# Patient Record
Sex: Female | Born: 1990 | Hispanic: No | Marital: Married | Smoking: Never smoker
Health system: Southern US, Community
[De-identification: ages and names within clinical notes are randomized; demographics above are authoritative.]

## PROBLEM LIST (undated history)

## (undated) DIAGNOSIS — O09299 Supervision of pregnancy with other poor reproductive or obstetric history, unspecified trimester: Secondary | ICD-10-CM

## (undated) DIAGNOSIS — L509 Urticaria, unspecified: Secondary | ICD-10-CM

## (undated) DIAGNOSIS — O139 Gestational [pregnancy-induced] hypertension without significant proteinuria, unspecified trimester: Secondary | ICD-10-CM

## (undated) HISTORY — DX: Gestational (pregnancy-induced) hypertension without significant proteinuria, unspecified trimester: O13.9

## (undated) HISTORY — DX: Supervision of pregnancy with other poor reproductive or obstetric history, unspecified trimester: O09.299

## (undated) HISTORY — DX: Urticaria, unspecified: L50.9

---
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
## 2013-02-22 NOTE — L&D Delivery Note (Signed)
Delivery Note   Cervix was complete at 1800 and labored down, FHR decel noted about 1930, pt still did not have urge to push, FHR continued to have decels, pt began actively pushing about 2030, FHR w variables, but overall reassuring, and difficulty tracing, FSE was placed, pt pushing well,   At 9:26 PM a viable female was delivered via Vaginal, Spontaneous Delivery (Presentation: ; Occiput Posterior).  Terminal meconium noted, immediate lusty cry APGAR: 8, 9; weight 8 lb 4.5  oz (3756 g).   Placenta status: Intact, Spontaneous.  Cord: 3 vessels with the following complications: None.  Cord pH: n/a   Anesthesia: Epidural  Episiotomy: None Lacerations: 2nd degree Suture Repair: 3.0 vicryl Est. Blood Loss (mL): 200  Mom to postpartum.  Baby to Couplet care / Skin to Skin. Pt plans to BF Routine PP orders   Malissa HippoShelley M Jacynda Brunke 06/01/2013, 11:27 PM

## 2013-06-01 ENCOUNTER — Encounter (HOSPITAL_COMMUNITY): Payer: Medicaid Other | Admitting: Anesthesiology

## 2013-06-01 ENCOUNTER — Encounter (HOSPITAL_COMMUNITY): Payer: Self-pay | Admitting: *Deleted

## 2013-06-01 ENCOUNTER — Inpatient Hospital Stay (HOSPITAL_COMMUNITY)
Admission: AD | Admit: 2013-06-01 | Discharge: 2013-06-03 | DRG: 775 | Disposition: A | Discharge status: Home / Self Care | Payer: Medicaid Other | Source: Ambulatory Visit | Attending: Obstetrics and Gynecology | Admitting: Obstetrics and Gynecology

## 2013-06-01 ENCOUNTER — Inpatient Hospital Stay (HOSPITAL_COMMUNITY): Payer: Medicaid Other | Admitting: Anesthesiology

## 2013-06-01 DIAGNOSIS — Z9289 Personal history of other medical treatment: Secondary | ICD-10-CM

## 2013-06-01 DIAGNOSIS — Z22330 Carrier of Group B streptococcus: Secondary | ICD-10-CM

## 2013-06-01 DIAGNOSIS — O9989 Other specified diseases and conditions complicating pregnancy, childbirth and the puerperium: Secondary | ICD-10-CM

## 2013-06-01 DIAGNOSIS — O360990 Maternal care for other rhesus isoimmunization, unspecified trimester, not applicable or unspecified: Secondary | ICD-10-CM | POA: Diagnosis present

## 2013-06-01 DIAGNOSIS — IMO0002 Reserved for concepts with insufficient information to code with codable children: Secondary | ICD-10-CM | POA: Diagnosis present

## 2013-06-01 DIAGNOSIS — O99892 Other specified diseases and conditions complicating childbirth: Secondary | ICD-10-CM | POA: Diagnosis present

## 2013-06-01 DIAGNOSIS — IMO0001 Reserved for inherently not codable concepts without codable children: Secondary | ICD-10-CM

## 2013-06-01 DIAGNOSIS — Z87891 Personal history of nicotine dependence: Secondary | ICD-10-CM

## 2013-06-01 LAB — OB RESULTS CONSOLE GC/CHLAMYDIA
Chlamydia: NEGATIVE
Gonorrhea: NEGATIVE

## 2013-06-01 LAB — CBC
HEMATOCRIT: 36.4 % (ref 36.0–46.0)
Hemoglobin: 12.1 g/dL (ref 12.0–15.0)
MCH: 27.7 pg (ref 26.0–34.0)
MCHC: 33.2 g/dL (ref 30.0–36.0)
MCV: 83.3 fL (ref 78.0–100.0)
Platelets: 243 10*3/uL (ref 150–400)
RBC: 4.37 MIL/uL (ref 3.87–5.11)
RDW: 14.9 % (ref 11.5–15.5)
WBC: 6.5 10*3/uL (ref 4.0–10.5)

## 2013-06-01 LAB — OB RESULTS CONSOLE ABO/RH: RH TYPE: NEGATIVE

## 2013-06-01 LAB — OB RESULTS CONSOLE HIV ANTIBODY (ROUTINE TESTING): HIV: NONREACTIVE

## 2013-06-01 LAB — OB RESULTS CONSOLE GBS: STREP GROUP B AG: POSITIVE

## 2013-06-01 LAB — OB RESULTS CONSOLE RPR: RPR: NONREACTIVE

## 2013-06-01 LAB — OB RESULTS CONSOLE RUBELLA ANTIBODY, IGM: Rubella: IMMUNE

## 2013-06-01 LAB — AMNISURE RUPTURE OF MEMBRANE (ROM) NOT AT ARMC: Amnisure ROM: POSITIVE

## 2013-06-01 LAB — RPR: RPR Ser Ql: REACTIVE — AB

## 2013-06-01 LAB — OB RESULTS CONSOLE HEPATITIS B SURFACE ANTIGEN: Hepatitis B Surface Ag: NEGATIVE

## 2013-06-01 LAB — OB RESULTS CONSOLE ANTIBODY SCREEN: ANTIBODY SCREEN: NEGATIVE

## 2013-06-01 LAB — RPR TITER

## 2013-06-01 MED ORDER — IBUPROFEN 600 MG PO TABS
600.0000 mg | ORAL_TABLET | Freq: Four times a day (QID) | ORAL | Status: DC
  Administered 2013-06-02 – 2013-06-03 (×6): 600 mg via ORAL
  Filled 2013-06-01 (×6): qty 1

## 2013-06-01 MED ORDER — ONDANSETRON HCL 4 MG/2ML IJ SOLN
4.0000 mg | Freq: Four times a day (QID) | INTRAMUSCULAR | Status: DC | PRN
  Administered 2013-06-01: 4 mg via INTRAVENOUS
  Filled 2013-06-01: qty 2

## 2013-06-01 MED ORDER — CITRIC ACID-SODIUM CITRATE 334-500 MG/5ML PO SOLN: 30.0000 mL | ORAL | Status: DC | PRN

## 2013-06-01 MED ORDER — BISACODYL 10 MG RE SUPP: 10.0000 mg | Freq: Every day | RECTAL | Status: DC | PRN

## 2013-06-01 MED ORDER — PRENATAL MULTIVITAMIN CH
1.0000 | ORAL_TABLET | Freq: Every day | ORAL | Status: DC
  Administered 2013-06-02 – 2013-06-03 (×2): 1 via ORAL
  Filled 2013-06-01 (×2): qty 1

## 2013-06-01 MED ORDER — FENTANYL 2.5 MCG/ML BUPIVACAINE 1/10 % EPIDURAL INFUSION (WH - ANES)
14.0000 mL/h | INTRAMUSCULAR | Status: DC | PRN
  Administered 2013-06-01 (×2): 14 mL/h via EPIDURAL
  Filled 2013-06-01 (×2): qty 125

## 2013-06-01 MED ORDER — ONDANSETRON HCL 4 MG PO TABS: 4.0000 mg | ORAL_TABLET | ORAL | Status: DC | PRN

## 2013-06-01 MED ORDER — PHENYLEPHRINE 40 MCG/ML (10ML) SYRINGE FOR IV PUSH (FOR BLOOD PRESSURE SUPPORT)
80.0000 ug | PREFILLED_SYRINGE | INTRAVENOUS | Status: DC | PRN
Start: 2013-06-01 — End: 2013-06-01
  Filled 2013-06-01: qty 2
  Filled 2013-06-01: qty 10

## 2013-06-01 MED ORDER — SIMETHICONE 80 MG PO CHEW: 80.0000 mg | CHEWABLE_TABLET | ORAL | Status: DC | PRN

## 2013-06-01 MED ORDER — WITCH HAZEL-GLYCERIN EX PADS: 1.0000 "application " | MEDICATED_PAD | CUTANEOUS | Status: DC | PRN

## 2013-06-01 MED ORDER — ACETAMINOPHEN 325 MG PO TABS: 650.0000 mg | ORAL_TABLET | ORAL | Status: DC | PRN

## 2013-06-01 MED ORDER — BENZOCAINE-MENTHOL 20-0.5 % EX AERO
1.0000 "application " | INHALATION_SPRAY | CUTANEOUS | Status: DC | PRN
  Administered 2013-06-02: 1 via TOPICAL
  Filled 2013-06-01: qty 56

## 2013-06-01 MED ORDER — LACTATED RINGERS IV SOLN
INTRAVENOUS | Status: DC
  Administered 2013-06-01 (×2): via INTRAVENOUS

## 2013-06-01 MED ORDER — OXYCODONE-ACETAMINOPHEN 5-325 MG PO TABS: 1.0000 | ORAL_TABLET | ORAL | Status: DC | PRN

## 2013-06-01 MED ORDER — OXYTOCIN BOLUS FROM INFUSION
500.0000 mL | INTRAVENOUS | Status: DC
  Administered 2013-06-01: 500 mL via INTRAVENOUS

## 2013-06-01 MED ORDER — LACTATED RINGERS IV SOLN: 500.0000 mL | INTRAVENOUS | Status: DC | PRN

## 2013-06-01 MED ORDER — TERBUTALINE SULFATE 1 MG/ML IJ SOLN: 0.2500 mg | Freq: Once | INTRAMUSCULAR | Status: DC | PRN

## 2013-06-01 MED ORDER — EPHEDRINE 5 MG/ML INJ
10.0000 mg | INTRAVENOUS | Status: DC | PRN
  Filled 2013-06-01: qty 4
  Filled 2013-06-01: qty 2

## 2013-06-01 MED ORDER — DIBUCAINE 1 % RE OINT: 1.0000 "application " | TOPICAL_OINTMENT | RECTAL | Status: DC | PRN

## 2013-06-01 MED ORDER — LANOLIN HYDROUS EX OINT: TOPICAL_OINTMENT | CUTANEOUS | Status: DC | PRN

## 2013-06-01 MED ORDER — LIDOCAINE HCL (PF) 1 % IJ SOLN
30.0000 mL | INTRAMUSCULAR | Status: DC | PRN
  Administered 2013-06-01: 30 mL via SUBCUTANEOUS
  Filled 2013-06-01: qty 30

## 2013-06-01 MED ORDER — OXYTOCIN 40 UNITS IN LACTATED RINGERS INFUSION - SIMPLE MED
1.0000 m[IU]/min | INTRAVENOUS | Status: DC
  Administered 2013-06-01: 1 m[IU]/min via INTRAVENOUS
  Filled 2013-06-01: qty 1000

## 2013-06-01 MED ORDER — ZOLPIDEM TARTRATE 5 MG PO TABS: 5.0000 mg | ORAL_TABLET | Freq: Every evening | ORAL | Status: DC | PRN

## 2013-06-01 MED ORDER — DIPHENHYDRAMINE HCL 50 MG/ML IJ SOLN: 12.5000 mg | INTRAMUSCULAR | Status: DC | PRN

## 2013-06-01 MED ORDER — EPHEDRINE 5 MG/ML INJ
10.0000 mg | INTRAVENOUS | Status: DC | PRN
  Filled 2013-06-01: qty 2

## 2013-06-01 MED ORDER — DIPHENHYDRAMINE HCL 25 MG PO CAPS: 25.0000 mg | ORAL_CAPSULE | Freq: Four times a day (QID) | ORAL | Status: DC | PRN

## 2013-06-01 MED ORDER — SENNOSIDES-DOCUSATE SODIUM 8.6-50 MG PO TABS
2.0000 | ORAL_TABLET | ORAL | Status: DC
  Administered 2013-06-02: 2 via ORAL
  Filled 2013-06-01: qty 2

## 2013-06-01 MED ORDER — PHENYLEPHRINE 40 MCG/ML (10ML) SYRINGE FOR IV PUSH (FOR BLOOD PRESSURE SUPPORT)
80.0000 ug | PREFILLED_SYRINGE | INTRAVENOUS | Status: DC | PRN
Start: 2013-06-01 — End: 2013-06-01
  Filled 2013-06-01: qty 2

## 2013-06-01 MED ORDER — PENICILLIN G POTASSIUM 5000000 UNITS IJ SOLR
5.0000 10*6.[IU] | Freq: Once | INTRAVENOUS | Status: AC
  Administered 2013-06-01: 5 10*6.[IU] via INTRAVENOUS
  Filled 2013-06-01: qty 5

## 2013-06-01 MED ORDER — SODIUM BICARBONATE 8.4 % IV SOLN
INTRAVENOUS | Status: DC | PRN
  Administered 2013-06-01: 5 mL via EPIDURAL

## 2013-06-01 MED ORDER — PENICILLIN G POTASSIUM 5000000 UNITS IJ SOLR
2.5000 10*6.[IU] | INTRAVENOUS | Status: DC
  Administered 2013-06-01 (×2): 2.5 10*6.[IU] via INTRAVENOUS
  Filled 2013-06-01 (×5): qty 2.5

## 2013-06-01 MED ORDER — ONDANSETRON HCL 4 MG/2ML IJ SOLN: 4.0000 mg | INTRAMUSCULAR | Status: DC | PRN

## 2013-06-01 MED ORDER — TETANUS-DIPHTH-ACELL PERTUSSIS 5-2.5-18.5 LF-MCG/0.5 IM SUSP: 0.5000 mL | Freq: Once | INTRAMUSCULAR | Status: DC

## 2013-06-01 MED ORDER — OXYTOCIN 40 UNITS IN LACTATED RINGERS INFUSION - SIMPLE MED: 62.5000 mL/h | INTRAVENOUS | Status: DC

## 2013-06-01 MED ORDER — FLEET ENEMA 7-19 GM/118ML RE ENEM: 1.0000 | ENEMA | Freq: Every day | RECTAL | Status: DC | PRN

## 2013-06-01 MED ORDER — MEDROXYPROGESTERONE ACETATE 150 MG/ML IM SUSP: 150.0000 mg | INTRAMUSCULAR | Status: DC | PRN

## 2013-06-01 MED ORDER — LACTATED RINGERS IV SOLN: 500.0000 mL | Freq: Once | INTRAVENOUS | Status: DC

## 2013-06-01 MED ORDER — IBUPROFEN 600 MG PO TABS
600.0000 mg | ORAL_TABLET | Freq: Four times a day (QID) | ORAL | Status: DC | PRN
  Administered 2013-06-01: 600 mg via ORAL
  Filled 2013-06-01: qty 1

## 2013-06-01 MED ORDER — MEASLES, MUMPS & RUBELLA VAC ~~LOC~~ INJ
0.5000 mL | INJECTION | Freq: Once | SUBCUTANEOUS | Status: DC
  Filled 2013-06-01: qty 0.5

## 2013-06-01 NOTE — Progress Notes (Signed)
  Subjective: Patient experiencing some variable decels.   Objective: BP 140/79  Pulse 116  Temp(Src) 98.9 F (37.2 C) (Oral)  Resp 16  Ht 5\' 5"  (1.651 m)  Wt 190 lb (86.183 kg)  BMI 31.62 kg/m2  SpO2 100%   Total I/O In: -  Out: 2550 [Urine:2550]  FHT: 150 bpm, Mod Var, + LateDecels, +Accels UC:   Q1-552mins, moderate--MVUs 150-180 SVE:   10/100/0--J.Orenthal Debski IUPC remains in place  Assessment:  IUP at 39.6wks Cat II FT GBS Positive RPR Reactive 2nd Stage Labor  Plan: Position change FTA-ABS for reactive RPR--results pending Allow to labor down until urge to push Continue to monitor  Marlene BastJessica Lynn Calyse Murcia , CNM, MSN 06/01/2013, 6:04 PM

## 2013-06-01 NOTE — Anesthesia Preprocedure Evaluation (Signed)

## 2013-06-01 NOTE — Progress Notes (Signed)
  Subjective: Patient remains comfortable with epidural.  Objective: BP 126/75  Pulse 94  Temp(Src) 98.2 F (36.8 C) (Oral)  Resp 18  Ht 5\' 5"  (1.651 m)  Wt 190 lb (86.183 kg)  BMI 31.62 kg/m2  SpO2 100%   Total I/O In: -  Out: 1300 [Urine:1300]  FHT:  150 bpm, Mod Var, -Decels, +Accels UC:   Q2-364min, mild to moderate SVE:   Dilation: 5 Effacement (%): 90 Station: -1 Exam by:: Gerrit HeckJessica Cayle Cordoba, cnm  Assessment:  IUP at 39.6wks Cat I FT GBS Positive Inadequate contractions  Plan: Start pitocin, now Continue present mgmt Dr. Audree CamelN. Dillard updated on patient status  Marlene BastJessica Lynn Kamaile Zachow 06/01/2013, 3:06 PM

## 2013-06-01 NOTE — Anesthesia Procedure Notes (Signed)

## 2013-06-01 NOTE — Progress Notes (Signed)
  Subjective:  Patient comfortable after epidural placement.    Objective: BP 133/77  Pulse 101  Temp(Src) 97.4 F (36.3 C) (Oral)  Resp 18  Ht 5\' 5"  (1.651 m)  Wt 190 lb (86.183 kg)  BMI 31.62 kg/m2  SpO2 100%     FHT:  150 bpm, Mod Var, -Decels, +Accels UC:   Q 2-4, moderate SVE:   Dilation: 5 Effacement (%): 90 Station: -1 Exam by:: Gerrit HeckJessica Leighla Chestnutt, cnm IUPC inserted w/o difficulty  Assessment:  IUP at 39.6wks Cat I FT SROM GBS Positive  Plan: Continue present mgmt Dr. Dorris CarnesN. Dillard updated   Joyice FasterJessica Lynn [facility] 06/01/2013, 10:40 AM

## 2013-06-01 NOTE — H&P (Signed)
  Melissa Howell is a 23 y.o. female, G1P0 at 39.6 weeks, presenting to MAU unannounced complaining of LOF. States she awoke at 0530 to use restroom and heard a "popping noise." Upon wiping patient reports some thin mucous type discharge. Patient states she put on a pad and returned to bed. Patient states she awoke again and pad was wet, but not saturated. Patient states that she went to restroom and upon wiping noted some pink discharge that was "running down" her leg. Patient reports contractions and active fetus.   There are no active problems to display for this patient.   History of present pregnancy: Patient entered care at 21.5 weeks.   EDC of 06/02/2013 was established by Definite LMP of 08/26/2012.   Anatomy scan:  21.5 weeks, with normal findings and an posterior placenta.   Additional US evaluations:  Presentation US at 37.5wks confirmed vertex.   [facility]   Last evaluation:  05/31/2013; 1/70/-2  OB History   Grav Para Term Preterm Abortions TAB SAB Ect Mult Living   1              History reviewed. No pertinent past medical history. History reviewed. No pertinent past surgical history. Family History: family history is not on file. Social History:  reports that she has quit smoking. She does not have any smokeless tobacco history on file. She reports that she does not drink alcohol or use illicit drugs.   Prenatal Transfer Tool  Maternal Diabetes: No Genetic Screening: Normal Maternal Ultrasounds/Referrals: Normal Fetal Ultrasounds or other Referrals:  None Maternal Substance Abuse:  No Significant Maternal Medications:  None Significant Maternal Lab Results: Lab values include: Group B Strep positive, Rh negative  ROS:  See Above  No Known Allergies   Dilation: 3 Effacement (%): 80 Station: -1 Exam by:: J. Daneya Hartgrove, CNM Blood pressure 150/87, pulse 104, temperature 98.4 F (36.9 C), temperature source Oral, resp. rate 18, height 5\' 5"   (1.651 m), weight 190 lb (86.183 kg), SpO2 100.00%.  Chest clear Heart RRR without murmur Abd gravid, NT Pelvic: 3/80/-1, midposition, vertex Ext: WNL  FHR: 145 bpm, Mod Var, -Decels, +Accels UCs:  Q1-193min, moderate to palpation Amniosure-Positive  Prenatal labs: ABO, Rh:   O Negative  Antibody:  Negative Rubella:   Immune RPR:   NR HBsAg:   Negative 01/10/13 HIV:   Negative GBS:  Positive 01/10/13 Sickle cell/Hgb electrophoresis:  Normal Pap:  Abnormal 01/25/2013 GC:  Negative Chlamydia:  Negative Genetic screenings:  Negative Glucola: Negtive Other:  N/a    Assessment IUP at 39.6wks Cat I FT SROM GBS Positive  [facility]: [facility] consult with Dr. Audree CamelN. Dillard Routine Labor and Delivery Orders Start PCN for GBS prophylaxis Okay for Epidural, Now  Joyice FasterJessica Lynn [facility]CNM, MSN 06/01/2013, 8:13 AM

## 2013-06-01 NOTE — Progress Notes (Signed)
CRITICAL VALUE ALERT  Critical value received:  RPR reactive with 1:2 titer  Date of notification:  06/01/2013  Time of notification:  1538  Critical value read back:yes  Nurse who received alert: Herma CarsonLindsay Murrel Freet, rn  MD notified (1st page):  Gerrit HeckJessica emly, cnm by cellphone  Time of first page:  1544  MD notified (2nd page):  Time of second page:  Responding MD: Gerrit Heckjessica emly, cnm   Time MD responded:  [phone]

## 2013-06-01 NOTE — MAU Note (Addendum)
PT SAYS SHE THINKS SROM AT 0530-    SHE WAS ASLEEP - WHEN SHE AWOKE -- SHE WENT TO B-ROOM.   CLEAR FLUID-  THEN PINK.  VE IN OFFICE YESTERDAY    1-2 CM.   DENIES HSV AND MRSA.  ON ASSESSMENT-  PERINEUM IS DRY

## 2013-06-01 NOTE — MAU Note (Signed)
Pt reports ROM at 0530, some contractions. Denies problems with pregnancy.

## 2013-06-02 ENCOUNTER — Encounter (HOSPITAL_COMMUNITY): Payer: Self-pay | Admitting: *Deleted

## 2013-06-02 DIAGNOSIS — IMO0002 Reserved for concepts with insufficient information to code with codable children: Secondary | ICD-10-CM | POA: Diagnosis present

## 2013-06-02 DIAGNOSIS — Z9289 Personal history of other medical treatment: Secondary | ICD-10-CM

## 2013-06-02 LAB — CBC
HCT: 30.9 % — ABNORMAL LOW (ref 36.0–46.0)
Hemoglobin: 10.1 g/dL — ABNORMAL LOW (ref 12.0–15.0)
MCH: 27.4 pg (ref 26.0–34.0)
MCHC: 32.7 g/dL (ref 30.0–36.0)
MCV: 83.7 fL (ref 78.0–100.0)
PLATELETS: 225 10*3/uL (ref 150–400)
RBC: 3.69 MIL/uL — ABNORMAL LOW (ref 3.87–5.11)
RDW: 15.2 % (ref 11.5–15.5)
WBC: 10.3 10*3/uL (ref 4.0–10.5)

## 2013-06-02 LAB — RPR

## 2013-06-02 MED ORDER — RHO D IMMUNE GLOBULIN 1500 UNIT/2ML IJ SOLN
300.0000 ug | Freq: Once | INTRAMUSCULAR | Status: AC
  Administered 2013-06-02: 300 ug via INTRAVENOUS
  Filled 2013-06-02: qty 2

## 2013-06-02 NOTE — Anesthesia Postprocedure Evaluation (Signed)
Anesthesia Post Note  Patient: Melissa Howell  Procedure(s) Performed: * No procedures listed *  Anesthesia type: Epidural  Patient location: Mother/Baby  Post pain: Pain level controlled  Post assessment: Post-op Vital signs reviewed  Last Vitals:  Filed Vitals:   06/02/13 0500  BP: 119/75  Pulse: 97  Temp: 36.6 C  Resp: 18    Post vital signs: Reviewed  Level of consciousness:alert  Complications: No apparent anesthesia complications

## 2013-06-02 NOTE — Lactation Note (Signed)
This note was copied from the chart of Melissa [place]. Lactation Consultation Note Follow up consult:  Baby's nose is still stuffy.  Put a drop of normal saline in each nostril but did not suction. Baby has been sneezing a lot which LC explained was a good thing to clear the baby's nose. Mother states baby recently breastfed for 10 min. Not showing cues.  Mother burped baby and she seems content. Reviewed cluster feeding and encouraged mother to nap. Patient Name: Melissa Howell ZOXWR'UToday's Date: 06/02/2013 Reason for consult: Follow-up assessment   Maternal Data    Feeding Feeding Type: Breast Fed Length of feed: 10 min  LATCH Score/Interventions                      Lactation Tools Discussed/Used     Consult Status Consult Status: Follow-up Date: 06/03/13 Follow-up type: In-patient    Dulce SellarRuth Boschen [name] 06/02/2013, 2:15 PM

## 2013-06-02 NOTE — Discharge Summary (Signed)
  Vaginal Delivery Discharge Summary  Melissa Howell  DOB:    12/25/1990 MRN:    [id number] CSN:    [id number]  Date of admission:                  06/01/13  Date of discharge:                   06/03/13  Procedures this admission:   SVB, repair of 2nd degree perineal laceration  Date of Delivery: 4/10/5  Newborn Data:  Live born female  Birth Weight: 8 lb 4.5 oz (3756 g) APGAR: 8, 9  Home with mother. Name: Melissa Howell   History of Present Illness:  Melissa Howell is a [age] y.o. female, G1P1001, who presents at 11022w6d weeks gestation. [facility] She was admitted onset of labor. Her pregnancy has been complicated by:  Patient Active Problem List   Diagnosis Date Noted  . Vaginal delivery 06/02/2013  . Positive RPR on admission 06/02/2013     Hospital Course:  Admitted 06/01/13 with SROM. Positive GBS. Progressed with pitocin augmentation. Utilized epidural for pain management.  Delivery was performed by Sanda KleinShelley Lillard, CNM, without complication. Patient and baby tolerated the procedure without difficulty, with 2nd degree laceration noted. Infant status was stable and remained in room with mother. Patient was noted to have a reactive RPR on admission, with titer 1:2.  Repeat RPR on 06/02/13 was negative, but confirmatory testing remained pending until being run on 06/04/13.  Mother and infant had an uncomplicated postpartum course, with breast feeding going well. Mom's physical exam was WNL, and she was discharged home in stable condition. Contraception plan: Nexplanon.  She received adequate benefit from po pain medications.   Feeding:  breast  Contraception:  Considering Nexplanon  Discharge hemoglobin:  Hemoglobin  Date Value Ref Range Status  06/02/2013 10.1* 12.0 - 15.0 g/dL Final     HCT  Date Value Ref Range Status  06/02/2013 30.9* 36.0 - 46.0 % Final     Discharge Physical Exam:   General: alert Lochia: appropriate Uterine Fundus: firm Incision: healing well DVT Evaluation: No evidence of DVT seen on physical exam. Negative Homan's sign.  Intrapartum Procedures: spontaneous vaginal delivery Postpartum Procedures: None Complications-Operative and Postpartum:   Discharge Diagnoses: Term Pregnancy-delivered  Discharge Information:  Activity:           pelvic rest Diet:                routine Medications: Ibuprofen and Percocet Condition:      stable Instructions:     Discharge to: home  NEEDS COLPO PP DUE TO LGSIL DURING PREGNANCY.    Nigel BridgemanVicki Merilee Wible 06/02/2013

## 2013-06-02 NOTE — Lactation Note (Signed)
This note was copied from the chart of Melissa Howell. Lactation Consultation Note Initial consult:  Reviewed hand expression, mother has a few drops of colostrum.  Mother's breasts are very firm.  Encouraged mother to compress breasts when latching. Baby's nose is stuffy and has a difficult time sustaining the latch.  She will open wide, suck for a few minutes and come off.  Put a drop of saline in each nostril and suctioned. Assisted mother in side lying and cross cradle to find the best position for baby.  Baby seems to do best with cross cradle. Encouraged mother to hand express or hand pump prior to latching and to call if further assistance is needed. Reviewed basics, massage, hand pump use, LC/OP support services and brochure. Patient Name: Melissa Howell Today's Date: 06/02/2013 Reason for consult: Initial assessment   Maternal Data Has patient been taught Hand Expression?: Yes Does the patient have breastfeeding experience prior to this delivery?: No  Feeding Feeding Type: Breast Fed Length of feed: 8 min (on and off pulls away after few sucks fussy)  LATCH Score/Interventions Latch: Repeated attempts needed to sustain latch, nipple held in mouth throughout feeding, stimulation needed to elicit sucking reflex. Intervention(s): Adjust position;Assist with latch;Breast massage;Breast compression  Audible Swallowing: A few with stimulation Intervention(s): Hand expression  Type of Nipple: Everted at rest and after stimulation  Comfort (Breast/Nipple): Soft / non-tender     Hold (Positioning): Assistance needed to correctly position infant at breast and maintain latch.  LATCH Score: 7  Lactation Tools Discussed/Used     Consult Status Consult Status: Follow-up Date: 06/03/13 Follow-up type: In-patient    Dulce SellarRuth Boschen Berkelhammer 06/02/2013, 9:23 AM

## 2013-06-02 NOTE — Progress Notes (Addendum)
Subjective: Postpartum Day 1: Vaginal delivery, 2nd degree perineal laceration Patient up ad lib, reports no syncope or dizziness. Feeding:  Breast Contraceptive plan:  Considering Nexplanon--declines OCPs, Depo, and IUD.  Objective: Vital signs in last 24 hours: Temp:  [97.8 F (36.6 C)-98.9 F (37.2 C)] 97.8 F (36.6 C) (04/11 0500) Pulse Rate:  [73-152] 97 (04/11 0500) Resp:  [16-18] 18 (04/11 0500) BP: (105-154)/(57-117) 119/75 mmHg (04/11 0500) SpO2:  [98 %-100 %] 100 % (04/10 2004)  Physical Exam:  General: alert Lochia: appropriate Uterine Fundus: firm Perineum: healing well DVT Evaluation: No evidence of DVT seen on physical exam. Negative Homan's sign.    Recent Labs  06/01/13 0815 06/02/13 0500  HGB 12.1 10.1*  HCT 36.4 30.9*   Results for orders placed during the hospital encounter of 06/01/13 (from the past 24 hour(s))  CBC     Status: Abnormal   Collection Time    06/02/13  5:00 AM      Result Value Ref Range   WBC 10.3  4.0 - 10.5 K/uL   RBC 3.69 (*) 3.87 - 5.11 MIL/uL   Hemoglobin 10.1 (*) 12.0 - 15.0 g/dL   HCT 16.130.9 (*) 09.636.0 - 04.546.0 %   MCV 83.7  78.0 - 100.0 fL   MCH 27.4  26.0 - 34.0 pg   MCHC 32.7  30.0 - 36.0 g/dL   RDW 40.915.2  81.111.5 - 91.415.5 %   Platelets 225  150 - 400 K/uL  RH IG WORKUP (INCLUDES ABO/RH)     Status: None   Collection Time    06/02/13  6:07 AM      Result Value Ref Range   Gestational Age(Wks) 39.6     ABO/RH(D) O NEG    RPR negative at NOB visit 12/2012 and on 02/26/13 RPR reactive on admission, with titer 1:2--confirmatory test cancelled by lab (? Reason). RPR repeated this am--awaiting results.  Assessment/Plan: Status post vaginal delivery day 1. Stable Reactive RPR on admission  Continue current care. Await RPR results. Plan for discharge tomorrow    Nigel BridgemanVicki Latham 06/02/2013, 10:51 AM   I just spoke with the lab.  Reflex antibody is pending. It will not be resulted until Monday.   Pt received PCNG for GBS  prophylaxsis.  She received three doses.  Will check with ID to see what the recommendation is because we are waiting for the confirmatory test.

## 2013-06-03 LAB — RH IG WORKUP (INCLUDES ABO/RH)
ABO/RH(D): O NEG
Antibody Screen: POSITIVE
DAT, IgG: NEGATIVE
Fetal Screen: NEGATIVE
Gestational Age(Wks): 39.6
UNIT DIVISION: 0

## 2013-06-03 MED ORDER — OXYCODONE-ACETAMINOPHEN 5-325 MG PO TABS: 1.0000 | ORAL_TABLET | ORAL | Status: DC | PRN

## 2013-06-03 MED ORDER — IBUPROFEN 600 MG PO TABS: 600.0000 mg | ORAL_TABLET | Freq: Four times a day (QID) | ORAL | Status: DC | PRN

## 2013-06-03 NOTE — Discharge Instructions (Signed)

## 2013-06-04 LAB — T.PALLIDUM AB, IGG: T PALLIDUM ANTIBODIES (TP-PA): 0.11 {s_co_ratio} (ref <0.90)

## 2013-06-04 NOTE — Progress Notes (Signed)
Post discharge ur review completed. 

## 2013-12-24 ENCOUNTER — Encounter (HOSPITAL_COMMUNITY): Payer: Self-pay | Admitting: *Deleted

## 2014-09-04 ENCOUNTER — Encounter (HOSPITAL_COMMUNITY): Payer: Self-pay | Admitting: *Deleted

## 2014-09-04 ENCOUNTER — Emergency Department (HOSPITAL_COMMUNITY)
Admission: EM | Admit: 2014-09-04 | Discharge: 2014-09-04 | Disposition: A | Discharge status: Home / Self Care | Payer: BLUE CROSS/BLUE SHIELD | Attending: Emergency Medicine | Admitting: Emergency Medicine

## 2014-09-04 DIAGNOSIS — K047 Periapical abscess without sinus: Secondary | ICD-10-CM

## 2014-09-04 DIAGNOSIS — R600 Localized edema: Secondary | ICD-10-CM | POA: Diagnosis present

## 2014-09-04 DIAGNOSIS — Z87891 Personal history of nicotine dependence: Secondary | ICD-10-CM | POA: Insufficient documentation

## 2014-09-04 DIAGNOSIS — Z792 Long term (current) use of antibiotics: Secondary | ICD-10-CM | POA: Diagnosis not present

## 2014-09-04 DIAGNOSIS — R220 Localized swelling, mass and lump, head: Secondary | ICD-10-CM

## 2014-09-04 MED ORDER — LIDOCAINE-EPINEPHRINE (PF) 2 %-1:200000 IJ SOLN
20.0000 mL | Freq: Once | INTRAMUSCULAR | Status: AC
  Administered 2014-09-04: 20 mL via INTRADERMAL
  Filled 2014-09-04: qty 20

## 2014-09-04 MED ORDER — KETOROLAC TROMETHAMINE 30 MG/ML IJ SOLN
30.0000 mg | Freq: Once | INTRAMUSCULAR | Status: AC
  Administered 2014-09-04: 30 mg via INTRAMUSCULAR
  Filled 2014-09-04: qty 1

## 2014-09-04 MED ORDER — CEPHALEXIN 500 MG PO CAPS: 500.0000 mg | ORAL_CAPSULE | Freq: Four times a day (QID) | ORAL | Status: DC

## 2014-09-04 NOTE — ED Provider Notes (Signed)
CSN: [id number]     Arrival date & time 09/04/14  1145 History   First MD Initiated Contact with Patient 09/04/14 1226     Chief Complaint  Patient presents with  . Facial Swelling     (Consider location/radiation/quality/duration/timing/severity/associated sxs/prior Treatment) HPI   Patient is a 24 year old female otherwise healthy, who presents with increasing left sided facial swelling secondary to a dental carry on the left upper jaw, evaluated by a dentist yesterday, and with scheduled extraction in 2 days.  She was given pain meds and clindamycin yesterday, but experienced increased swelling overnight, and was instructed by the dentist to present to the emergency department with any complication.  She has increased pain with the swelling, located over the left cheek, but denies any difficulty opening her mouth, speaking, swallowing or breathing.  She denies any fever or chills, and has not had any nausea or vomiting.  She is currently taking clindamycin for the infection, and states her dentist gave her pain medicine however she is hesitant to take it because she has nursing.  Ibuprofen has not helped with her pain.  History reviewed. No pertinent past medical history. History reviewed. No pertinent past surgical history. History reviewed. No pertinent family history. History  Substance Use Topics  . Smoking status: Former Games developermoker  . Smokeless tobacco: Not on file  . Alcohol Use: No   OB History    Gravida Para Term Preterm AB TAB SAB Ectopic Multiple Living   1 1 1       1      Review of Systems  All other systems reviewed and are negative.     Allergies  Review of patient's allergies indicates no known allergies.  Home Medications   Prior to Admission medications   Medication Sig Start Date End Date Taking? Authorizing Provider  cetirizine (ZYRTEC) 10 MG tablet Take 10 mg by mouth at bedtime.   Yes Historical Provider, MD  clindamycin (CLEOCIN) 150 MG capsule Take 150  mg by mouth 3 (three) times daily.   Yes Historical Provider, MD  ibuprofen (ADVIL,MOTRIN) 600 MG tablet Take 1 tablet (600 mg total) by mouth every 6 (six) hours as needed. 06/03/13  Yes Nigel BridgemanVicki Latham, CNM   BP 133/68 mmHg  Pulse 82  Temp(Src) 98.3 F (36.8 C) (Oral)  Resp 22  Ht 5\' 5"  (1.651 m)  Wt 146 lb 6.4 oz (66.407 kg)  BMI 24.36 kg/m2  SpO2 99%  LMP 08/21/2014 Physical Exam  Constitutional: She is oriented to person, place, and time. She appears well-developed and well-nourished. No distress.  Nontoxic-appearing young woman appears stated age  HENT:  Head: Normocephalic and atraumatic.  Right Ear: External ear normal.  Left Ear: External ear normal.  Nose: Nose normal.  Mouth/Throat: Uvula is midline and oropharynx is clear and moist. Mucous membranes are not pale, not dry and not cyanotic. No oral lesions. No trismus in the jaw. No uvula swelling or lacerations. No oropharyngeal exudate, posterior oropharyngeal erythema or tonsillar abscesses.    Left cheek and gums minimally tender to palpation Left upper jaw, area of induration spreading into the left cheek, patent saliva gland noted in this area No sublingual tenderness  Eyes: Conjunctivae and EOM are normal. Pupils are equal, round, and reactive to light. Right eye exhibits no discharge. Left eye exhibits no discharge. No scleral icterus.  Neck: Normal range of motion. No JVD present. No tracheal deviation present. No thyromegaly present.  Cardiovascular: Normal rate, regular rhythm, normal heart sounds and intact  distal pulses.  Exam reveals no gallop and no friction rub.   No murmur heard. Pulmonary/Chest: Effort normal and breath sounds normal. No accessory muscle usage or stridor. No tachypnea. No respiratory distress. She has no decreased breath sounds. She has no wheezes. She has no rhonchi. She has no rales. She exhibits no tenderness.  Abdominal: Soft. Bowel sounds are normal. She exhibits no distension and no mass.  There is no tenderness. There is no rebound and no guarding.  Musculoskeletal: Normal range of motion. She exhibits no edema or tenderness.  Lymphadenopathy:    She has no cervical adenopathy.  Neurological: She is alert and oriented to person, place, and time. She has normal reflexes. No cranial nerve deficit. She exhibits normal muscle tone. Coordination normal.  Skin: Skin is warm and dry. No rash noted. She is not diaphoretic. No erythema. No pallor.  Left cheek is swollen, no spreading redness  Psychiatric: She has a normal mood and affect. Her behavior is normal. Judgment and thought content normal.  Nursing note and vitals reviewed.   ED Course  Procedures (including critical care time) Labs Review Labs Reviewed - No data to display  Imaging Review No results found.   EKG Interpretation None      INCISION AND DRAINAGE Performed by: Danelle Berry Consent: Verbal consent obtained. Risks and benefits: risks, benefits and alternatives were discussed Time out performed prior to procedure Type: abscess Body area: L upper jaw/cheek near tooth #15 Anesthesia: local infiltration Incision was made with a scalpel. Local anesthetic: lidocaine 2% with epinephrine Anesthetic total: 6 ml Complexity: simple Simple stab with scaple - no drainage  Drainage amount: n/a Packing material: n/a Patient tolerance: Patient tolerated the procedure well with no immediate complications.     MDM   Final diagnoses:  None    Pt with dental infection presents with increased left-sided facial swelling, almost 24 hours of clinda Abx treatment  I&D attempted, no purulent drainage Continue clindamycin and added keflex. Strict return precautions given, pain medications given.  Patient was encouraged to keep her follow-up 2 days from now.  Patient has pain medicine at home, as prescribed in the dentist, has refused pain medicine here in the ER because she is nursing.  Patient has swelling to  her left face and cheek for vital signs are stable patient is afebrile mild tachycardia likely due to pain, tolerated procedure well. Patient is agreeable to discharge home with additional antibiotic.  She has repeatedly asked if there is a medicine to make her swelling go down.  I have explained to her that when the tooth is removed and with Abx course possible infection, the swelling will gradually go down.  Patient was given dental resources, strict return precautions, and was discharged home in stable condition        Danelle Berry, PA-C 09/11/14 0434  Raeford Razor, MD 09/11/14 [phone]

## 2014-09-04 NOTE — Discharge Instructions (Signed)
Dental Extraction A dental extraction procedure refers to a routine tooth extraction performed by your dentist. The procedure depends on where and how the tooth is positioned. The procedure can be very quick, sometimes lasting only seconds. Reasons for dental extraction include:  Tooth decay.  Infections (abscesses).  The need to make room for other teeth.  Gum diseases where the supporting bone has been destroyed.  Fractures of the tooth leaving it unrestorable.  Extra teeth (supernumerary) or grossly malformed teeth.  Baby teeththat have not fallen out in time and have not permitted the the permanent teeth to erupt properly.  In preparation for braces where there is not enough room to align the teeth properly.  Not enough room for wisdom teeth (particularly those that are impacted).  Prior to receiving radiation to the head and neck,teeth in the field of radiation may need to be extracted. LET YOUR CAREGIVER KNOW ABOUT:  Any allergies.  All medicines you are taking:  Including herbs, eye drops, over-the-counter medications, and creams.  Blood thinners (anticoagulants), aspirin, or other drugs that may affect blood clotting.  Use of steroids (through mouth or as creams).  Previous problems with anesthetics, including local anesthetics.  History of bleeding or blood problems.  Previous surgery.  Possibility of pregnancy if this applies.  Smoking history.  Any health problems. RISKS AND COMPLICATIONS As with any procedure, complications may occur, but they can usually be managed by your caregiver. General surgical complications may include:  Reaction to anesthesia.  Damage to surrounding teeth, nerves, tissues, or structures.  Infection.  Bleeding. With appropriate treatment and care after surgery, the following complications are very uncommon:  Dry socket (blood clot does not form or stay in place over empty socket). This can delay healing.  Incomplete  extraction of roots.  Jawbone injury, pain, or weakness. BEFORE THE PROCEDURE  Your dental care provider will:  Take a medical and dental history.  Take an X-ray to evaluate the circumstances and how to best extract the tooth.  Do an oral exam.  Depending on the situation, antibiotics may be given before or after the extraction.  Your caregivers may review the procedure, the local anesthesia and/or sedation being used, and what to expect after the procedure with you.  If needed, your dentist may give you a form of sedation, either by medicine you swallow, gas, or intravenously (IV). This will help to relieve anxiety. Complicated extractions may require the use of general anesthesia. It is important to follow your caregiver's instructions prior to your procedure to avoid complications. Steps before your procedure may include:  Alert your caregiver if you feel ill (sore throat, fever, upset stomach, etc.) in the days leading up to your procedure.  Stop taking certain medications for several days prior to your procedure such as blood thinners.  Take certain medications, such as antibiotics.  Avoid eating and drinking for several hours before the procedure. This will help you to avoid complications from the sedation or anesthesia.  Sign a patient consent form.  Have a friend or family member drive you to the dentist and drive you home after the procedure.  Wear comfortable, loose clothing. Limit makeup and jewelry.  Quit smoking. If you are a smoker, this will raise the chances of a healing problem after your procedure. If you are thinking about quitting, talk to your surgeon about how long before the operation you should stop smoking. You may also get help from your primary caregiver. PROCEDURE Dental extraction is typically done  as an outpatient procedure. IV sedation, local anesthesia, or both may be used. It will keep you comfortable and free of pain during the procedure.    There are 2 types of extractions:  Simple extraction involves a tooth that is visible in the mouth and above the gum line. After local anesthetic is given by injection, and the area is numbed, the dentist will loosen the tooth with a special instrument (elevator). Then another instrument (forceps) will be used to grasp the tooth and remove it from its socket. During the procedure you will feel some pressure, but you should not feel pain. If you do feel pain, tell your dentist. The open socket will be cleaned. Dressings (gauze) will be placed in the socket to reduce bleeding.  Surgical extractions are used if the tooth has not come into the mouth or the tooth is broken off below the gum line. The dentist will make a cut (incision) in the gum and may have to remove some of the bone around the tooth to aid in the removal of the tooth. After removal, stitches (sutures) may be required to close the area to help in healing and control bleeding. For some surgical extractions, you may need a general anesthetic or IV sedation (through the vein). After both types of extractions, you may be given pain medication or other drugs to help healing. Other postoperative instructions will be given by your dental caregiver. AFTER THE PROCEDURE  You will have gauze in your mouth where the tooth was removed. Gentle pressure on the gauze for up to 1 hour will help to control bleeding.  A blood clot will begin to form over the open socket. This is normal. Do not touch the area or rinse it.  Your pain will be controlled with medication and self-care.  You will be given detailed instructions for care after surgery. PROGNOSIS While some discomfort is normal after tooth extraction, most patients recover fully in just a few days. SEEK IMMEDIATE DENTAL CARE  You have uncontrolled bleeding, marked swelling, or severe pain.  You develop a fever, difficulty swallowing, or other severe symptoms.  You have questions or  concerns. Document Released: 02/08/2005 Document Revised: 06/25/2013 Document Reviewed: 05/15/2010 [facility] Patient Information 2015 [place], [place]. This information is not intended to replace advice given to you by your health care provider. Make sure you discuss any questions you have with your health care provider.  Abscess An abscess is an infected area that contains a collection of pus and debris.It can occur in almost any part of the body. An abscess is also known as a furuncle or boil. CAUSES  An abscess occurs when tissue gets infected. This can occur from blockage of oil or sweat glands, infection of hair follicles, or a minor injury to the skin. As the body tries to fight the infection, pus collects in the area and creates pressure under the skin. This pressure causes pain. People with weakened immune systems have difficulty fighting infections and get certain abscesses more often.  SYMPTOMS Usually an abscess develops on the skin and becomes a painful mass that is red, warm, and tender. If the abscess forms under the skin, you may feel a moveable soft area under the skin. Some abscesses break open (rupture) on their own, but most will continue to get worse without care. The infection can spread deeper into the body and eventually into the bloodstream, causing you to feel ill.  DIAGNOSIS  Your caregiver will take your medical history and  perform a physical exam. A sample of fluid may also be taken from the abscess to determine what is causing your infection. TREATMENT  Your caregiver may prescribe antibiotic medicines to fight the infection. However, taking antibiotics alone usually does not cure an abscess. Your caregiver may need to make a small cut (incision) in the abscess to drain the pus. In some cases, gauze is packed into the abscess to reduce pain and to continue draining the area. HOME CARE INSTRUCTIONS   Only take over-the-counter or prescription medicines for pain, discomfort,  or fever as directed by your caregiver.  If you were prescribed antibiotics, take them as directed. Finish them even if you start to feel better.  If gauze is used, follow your caregiver's directions for changing the gauze.  To avoid spreading the infection:  Keep your draining abscess covered with a bandage.  Wash your hands well.  Do not share personal care items, towels, or whirlpools with others.  Avoid skin contact with others.  Keep your skin and clothes clean around the abscess.  Keep all follow-up appointments as directed by your caregiver. SEEK MEDICAL CARE IF:   You have increased pain, swelling, redness, fluid drainage, or bleeding.  You have muscle aches, chills, or a general ill feeling.  You have a fever. MAKE SURE YOU:   Understand these instructions.  Will watch your condition.  Will get help right away if you are not doing well or get worse. Document Released: 11/18/2004 Document Revised: 08/10/2011 Document Reviewed: 04/23/2011 [facility] Patient Information 2015 [place], [place]. This information is not intended to replace advice given to you by your health care provider. Make sure you discuss any questions you have with your health care provider.

## 2014-09-04 NOTE — ED Notes (Signed)
Water given to pt, PO challenge per order.

## 2014-09-04 NOTE — ED Notes (Signed)
PA at beside.

## 2014-09-04 NOTE — ED Notes (Signed)
Pt reports having dental pain left side, now has moderate swelling to face. Airway intact. Denies fever.

## 2017-03-11 ENCOUNTER — Ambulatory Visit: Payer: BLUE CROSS/BLUE SHIELD | Admitting: Certified Nurse Midwife

## 2017-04-01 ENCOUNTER — Ambulatory Visit: Payer: Medicaid Other | Admitting: Certified Nurse Midwife

## 2017-06-06 ENCOUNTER — Encounter (HOSPITAL_COMMUNITY): Payer: Self-pay | Admitting: Emergency Medicine

## 2017-06-06 ENCOUNTER — Ambulatory Visit (HOSPITAL_COMMUNITY)
Admission: EM | Admit: 2017-06-06 | Discharge: 2017-06-06 | Disposition: A | Discharge status: Home / Self Care | Payer: Medicaid Other | Attending: Urgent Care | Admitting: Urgent Care

## 2017-06-06 ENCOUNTER — Other Ambulatory Visit: Payer: Self-pay

## 2017-06-06 ENCOUNTER — Ambulatory Visit (INDEPENDENT_AMBULATORY_CARE_PROVIDER_SITE_OTHER): Payer: Medicaid Other

## 2017-06-06 DIAGNOSIS — R202 Paresthesia of skin: Secondary | ICD-10-CM

## 2017-06-06 DIAGNOSIS — S60212A Contusion of left wrist, initial encounter: Secondary | ICD-10-CM

## 2017-06-06 DIAGNOSIS — M542 Cervicalgia: Secondary | ICD-10-CM

## 2017-06-06 DIAGNOSIS — M25532 Pain in left wrist: Secondary | ICD-10-CM

## 2017-06-06 MED ORDER — CYCLOBENZAPRINE HCL 5 MG PO TABS: 10.0000 mg | ORAL_TABLET | Freq: Three times a day (TID) | ORAL | 0 refills | Status: DC | PRN

## 2017-06-06 MED ORDER — MELOXICAM 7.5 MG PO TABS: 7.5000 mg | ORAL_TABLET | Freq: Every day | ORAL | 0 refills | Status: DC
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
## 2018-03-10 ENCOUNTER — Encounter (HOSPITAL_COMMUNITY): Payer: Self-pay | Admitting: Emergency Medicine

## 2018-03-10 ENCOUNTER — Ambulatory Visit (HOSPITAL_COMMUNITY)
Admission: EM | Admit: 2018-03-10 | Discharge: 2018-03-10 | Disposition: A | Discharge status: Home / Self Care | Payer: Medicaid Other | Attending: Internal Medicine | Admitting: Internal Medicine

## 2018-03-10 DIAGNOSIS — Z578 Occupational exposure to other risk factors: Secondary | ICD-10-CM | POA: Diagnosis not present

## 2018-03-10 NOTE — ED Triage Notes (Signed)
Pt reports fingerstick w/a dirty "scaler" .... works at a [employer]  Med hx of pt she was working w/unknown.   Pt has a small abrasion at base of fingernail on left 3rd digit.   Needing post-exposure work up/labs.   A&O x4... NAD.Marland Kitchen. ambulatory

## 2018-03-10 NOTE — ED Provider Notes (Signed)
[facility]    CSN: [id number] Arrival date & time: 03/10/18  1720     History   Chief Complaint Chief Complaint  Patient presents with  . Body Fluid Exposure    HPI Kamora S Manson PasseyBrown is a 28 y.o. female.   28 year old female with no chronic medical problems presents to urgent care following a blood exposure at work.  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  She was wearing one pair of gloves.  The wound did not bleed and did not sting when she cleaned it with soap and water.       History reviewed. No pertinent past medical history.  Patient Active Problem List   Diagnosis Date Noted  . Vaginal delivery 06/02/2013  . 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 No pertinent surgical history.  OB History    Gravida  1   Para  1   Term  1   Preterm      AB      Living  1     SAB      TAB      Ectopic      Multiple      Live Births  1            Home Medications    Prior to Admission medications   Medication Sig Start Date End Date Taking? Authorizing Provider  cyclobenzaprine (FLEXERIL) 5 MG tablet Take 2 tablets (10 mg total) by mouth 3 (three) times daily as needed for muscle spasms. 06/06/17   Wallis BambergMani, Mario, PA-C  meloxicam (MOBIC) 7.5 MG tablet Take 1 tablet (7.5 mg total) by mouth daily. 06/06/17   Wallis BambergMani, Mario, PA-C    Family History Family History  Problem Relation Age of Onset  . Healthy Mother     Social History Social History   Tobacco Use  . Smoking status: Former Games developermoker  . Smokeless tobacco: Never Used  Substance Use Topics  . Alcohol use: No  . Drug use: No     Allergies   Patient has no known allergies.   Review of Systems Review of Systems  Constitutional: Negative for chills and fever.  HENT: Negative for sore throat and tinnitus.   Eyes: Negative for redness.    Respiratory: Negative for cough and shortness of breath.   Cardiovascular: Negative for chest pain and palpitations.  Gastrointestinal: Negative for abdominal pain, diarrhea, nausea and vomiting.  Genitourinary: Negative for dysuria, frequency and urgency.  Musculoskeletal: Negative for myalgias.  Skin: Negative for rash.       No lesions  Neurological: Negative for weakness.  Hematological: Does not bruise/bleed easily.  Psychiatric/Behavioral: Negative for suicidal ideas.     Physical Exam Triage Vital Signs ED Triage Vitals [03/10/18 1921]  Enc Vitals Group     BP (!) 137/93     Pulse Rate 80     Resp 16     Temp 98.2 F (36.8 C)     Temp Source Oral     SpO2 99 %     Weight      Height      Head Circumference      Peak Flow      Pain Score 0     Pain Loc      Pain Edu?      Excl. in GC?    No data  found.  Updated Vital Signs BP (!) 137/93 (BP Location: Right Arm)   Pulse 80   Temp 98.2 F (36.8 C) (Oral)   Resp 16   LMP 03/08/2018   SpO2 99%   Breastfeeding No   Visual Acuity Right Eye Distance:   Left Eye Distance:   Bilateral Distance:    Right Eye Near:   Left Eye Near:    Bilateral Near:     Physical Exam Vitals signs and nursing note reviewed.  Constitutional:      General: She is not in acute distress.    Appearance: She is well-developed.  HENT:     Head: Normocephalic and atraumatic.  Eyes:     General: No scleral icterus.    Conjunctiva/sclera: Conjunctivae normal.     Pupils: Pupils are equal, round, and reactive to light.  Neck:     Musculoskeletal: Normal range of motion and neck supple.     Thyroid: No thyromegaly.     Vascular: No JVD.     Trachea: No tracheal deviation.  Cardiovascular:     Rate and Rhythm: Normal rate and regular rhythm.     Heart sounds: Normal heart sounds. No murmur. No friction rub. No gallop.   Pulmonary:     Effort: Pulmonary effort is normal.     Breath sounds: Normal breath sounds.  Abdominal:      General: Bowel sounds are normal. There is no distension.     Palpations: Abdomen is soft.     Tenderness: There is no abdominal tenderness.  Musculoskeletal: Normal range of motion.  Lymphadenopathy:     Cervical: No cervical adenopathy.  Skin:    General: Skin is warm and dry.  Neurological:     Mental Status: She is alert and oriented to person, place, and time.     Cranial Nerves: No cranial nerve deficit.  Psychiatric:        Behavior: Behavior normal.        Thought Content: Thought content normal.        Judgment: Judgment normal.      UC Treatments / Results  Labs (all labs ordered are listed, but only abnormal results are displayed) Labs Reviewed  HIV ANTIBODY (ROUTINE TESTING W REFLEX)  HEPATITIS PANEL, ACUTE  RPR    EKG None  Radiology No results found.  Procedures Procedures (including critical care time)  Medications Ordered in UC Medications - No data to display  Initial Impression / Assessment and Plan / UC Course  I have reviewed the triage vital signs and the nursing notes.  Pertinent labs & imaging results that were available during my care of the patient were reviewed by me and considered in my medical decision making (see chart for details).     Ordered screening labs for blood-borne illnesses. Review of past medical history shows positive RPR at time of delivery 5 years ago.  Labs including RPR ordered to document baseline.  Patient to return to occupational health in approximately 1 month pacifically to follow-up on hepatitis panel prior to window period.   Final Clinical Impressions(s) / UC Diagnoses   Final diagnoses:  Employee exposure to blood   Discharge Instructions   None    ED Prescriptions    None     Controlled Substance Prescriptions [place] Controlled Substance Registry consulted? Not Applicable   Arnaldo Nataliamond, Shashwat Cleary S, MD 03/10/18 2028

## 2018-03-10 NOTE — ED Notes (Signed)
Pt discharged by provider.

## 2018-03-11 LAB — RPR: RPR Ser Ql: REACTIVE — AB

## 2018-03-11 LAB — RPR, QUANT+TP ABS (REFLEX)
Rapid Plasma Reagin, Quant: 1:1 {titer} — ABNORMAL HIGH
T Pallidum Abs: NONREACTIVE

## 2018-03-11 LAB — HIV ANTIBODY (ROUTINE TESTING W REFLEX): HIV Screen 4th Generation wRfx: NONREACTIVE

## 2018-03-11 LAB — HEPATITIS PANEL, ACUTE
HCV Ab: <0.1 s/co ratio (ref 0.0–0.9)
Hep A IgM: NEGATIVE
Hep B C IgM: NEGATIVE
Hepatitis B Surface Ag: NEGATIVE

## 2018-03-13 ENCOUNTER — Telehealth (HOSPITAL_COMMUNITY): Payer: Self-pay | Admitting: Emergency Medicine

## 2018-03-13 NOTE — Telephone Encounter (Signed)
RPR Qualitative is reactive. This is a nonspecific test for syphilis. T Pallidum Abs are Non Reactive which indicates patient does NOT have syphilis. Patient contacted and made aware of all results, all questions answered.

## 2018-12-05 DIAGNOSIS — M2630 Unspecified anomaly of tooth position of fully erupted tooth or teeth: Secondary | ICD-10-CM | POA: Diagnosis not present

## 2019-01-05 DIAGNOSIS — Z3490 Encounter for supervision of normal pregnancy, unspecified, unspecified trimester: Secondary | ICD-10-CM | POA: Diagnosis not present

## 2019-01-05 DIAGNOSIS — N912 Amenorrhea, unspecified: Secondary | ICD-10-CM | POA: Diagnosis not present

## 2019-01-05 DIAGNOSIS — Z1389 Encounter for screening for other disorder: Secondary | ICD-10-CM | POA: Diagnosis not present

## 2019-01-05 DIAGNOSIS — F064 Anxiety disorder due to known physiological condition: Secondary | ICD-10-CM | POA: Diagnosis not present

## 2019-01-05 DIAGNOSIS — K219 Gastro-esophageal reflux disease without esophagitis: Secondary | ICD-10-CM | POA: Diagnosis not present

## 2019-01-18 ENCOUNTER — Encounter (HOSPITAL_COMMUNITY): Payer: Self-pay | Admitting: *Deleted

## 2019-01-18 ENCOUNTER — Other Ambulatory Visit: Payer: Self-pay

## 2019-01-18 ENCOUNTER — Emergency Department (HOSPITAL_COMMUNITY)
Admission: EM | Admit: 2019-01-18 | Discharge: 2019-01-18 | Disposition: A | Discharge status: Home / Self Care | Payer: Medicaid Other | Attending: Emergency Medicine | Admitting: Emergency Medicine

## 2019-01-18 DIAGNOSIS — Y999 Unspecified external cause status: Secondary | ICD-10-CM | POA: Diagnosis not present

## 2019-01-18 DIAGNOSIS — S61012A Laceration without foreign body of left thumb without damage to nail, initial encounter: Secondary | ICD-10-CM | POA: Insufficient documentation

## 2019-01-18 DIAGNOSIS — Y92000 Kitchen of unspecified non-institutional (private) residence as  the place of occurrence of the external cause: Secondary | ICD-10-CM | POA: Insufficient documentation

## 2019-01-18 DIAGNOSIS — Z87891 Personal history of nicotine dependence: Secondary | ICD-10-CM | POA: Insufficient documentation

## 2019-01-18 DIAGNOSIS — Y288XXA Contact with other sharp object, undetermined intent, initial encounter: Secondary | ICD-10-CM | POA: Insufficient documentation

## 2019-01-18 DIAGNOSIS — Y93G9 Activity, other involving cooking and grilling: Secondary | ICD-10-CM | POA: Diagnosis not present

## 2019-01-18 MED ORDER — POVIDONE-IODINE 10 % EX SOLN
CUTANEOUS | Status: AC
  Filled 2019-01-18: qty 15

## 2019-01-18 MED ORDER — LIDOCAINE HCL (PF) 1 % IJ SOLN
INTRAMUSCULAR | Status: AC
  Filled 2019-01-18: qty 4

## 2019-01-18 NOTE — Discharge Instructions (Signed)
Have your sutures removed in 10 days.  Keep your wound clean and dry,  Until a good scab forms - you may then wash gently twice daily with mild soap and water, but dry completely after.  Get rechecked for any sign of infection (redness,  Swelling,  Increased pain or drainage of purulent fluid). ° °

## 2019-01-18 NOTE — ED Provider Notes (Signed)
[facility] EMERGENCY DEPARTMENT Provider Note   CSN: [id number] Arrival date & time: 01/18/19  1420     History   Chief Complaint Chief Complaint  Patient presents with  . Extremity Laceration    HPI Melissa Howell is a 28 y.o. female presenting with laceration to her left thumb, occurred just prior to arrival on the edge of a soup can. She denies numbness in the fingertip. It bled copiously but is controlled with pressure.  She denies radiation of pain.  Her last tetanus was completed in 2018.     The history is provided by the patient.    History reviewed. No pertinent past medical history.  Patient Active Problem List   Diagnosis Date Noted  . Vaginal delivery 06/02/2013  . 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 No pertinent surgical history.   OB History    Gravida  1   Para  1   Term  1   Preterm      AB      Living  1     SAB      TAB      Ectopic      Multiple      Live Births  1            Home Medications    Prior to Admission medications   Medication Sig Start Date End Date Taking? Authorizing Provider  cyclobenzaprine (FLEXERIL) 5 MG tablet Take 2 tablets (10 mg total) by mouth 3 (three) times daily as needed for muscle spasms. 06/06/17   Wallis Bamberg, PA-C  meloxicam (MOBIC) 7.5 MG tablet Take 1 tablet (7.5 mg total) by mouth daily. 06/06/17   Wallis Bamberg, PA-C    Family History Family History  Problem Relation Age of Onset  . Healthy Mother     Social History Social History   Tobacco Use  . Smoking status: Former Games developer  . Smokeless tobacco: Never Used  Substance Use Topics  . Alcohol use: No  . Drug use: No     Allergies   Patient has no known allergies.   Review of Systems Review of Systems  Constitutional: Negative for chills and fever.  Skin: Positive for wound.  Neurological: Negative for weakness and  numbness.  All other systems reviewed and are negative.    Physical Exam Updated Vital Signs BP (!) 125/91   Pulse 82   Temp 98.4 F (36.9 C) (Oral)   Resp 17   Ht 5\' 5"  (1.651 m)   Wt 72.6 kg   SpO2 100%   BMI 26.63 kg/m   Physical Exam Constitutional:      Appearance: She is well-developed.  HENT:     Head: Normocephalic.  Cardiovascular:     Rate and Rhythm: Normal rate.  Pulmonary:     Effort: Pulmonary effort is normal.  Musculoskeletal:        General: Tenderness present.  Skin:    Findings: Laceration present.     Comments: 2 cm linear subcutaneous laceration horizontal across volar thumb tuft.  Hemostatic.  Neurological:     Mental Status: She is alert and oriented to person, place, and time.     Sensory: No sensory deficit.      ED Treatments / Results  Labs (all labs ordered are listed, but only abnormal results are displayed) Labs Reviewed - No data to display  EKG None  Radiology  No results found.  Procedures Procedures (including critical care time)  LACERATION REPAIR Performed by: Evalee Jefferson Authorized by: Evalee Jefferson Consent: Verbal consent obtained. Risks and benefits: risks, benefits and alternatives were discussed Consent given by: patient Patient identity confirmed: provided demographic data Prepped and Draped in normal sterile fashion Wound explored  Laceration Location: left distal volar thumb  Laceration Length: 2cm  No Foreign Bodies seen or palpated  Anesthesia: digital block Local anesthetic: lidocaine 1% without epinephrine  Anesthetic total: 4 ml  Irrigation method: syringe Amount of cleaning: standard  Skin closure: ethilon 4-0  Number of sutures: 6  Technique: simple interrupted  Patient tolerance: Patient tolerated the procedure well with no immediate complications.   Medications Ordered in ED Medications  lidocaine (PF) (XYLOCAINE) 1 % injection (has no administration in time range)  povidone-iodine  (BETADINE) 10 % external solution (has no administration in time range)     Initial Impression / Assessment and Plan / ED Course  I have reviewed the triage vital signs and the nursing notes.  Pertinent labs & imaging results that were available during my care of the patient were reviewed by me and considered in my medical decision making (see chart for details).        Wound care instructions given.  Pt advised to have sutures removed in 10 days,  Return here sooner for any signs of infection including redness, swelling, worse pain or drainage of pus.     Final Clinical Impressions(s) / ED Diagnoses   Final diagnoses:  Laceration of left thumb without foreign body without damage to nail, initial encounter    ED Discharge Orders    None       Landis Martins 01/18/19 1906    Virgel Manifold, MD 01/19/19 2034

## 2019-01-18 NOTE — ED Triage Notes (Signed)
Laceration to left thumb, cut on a can, bandaged on arrival

## 2019-01-29 ENCOUNTER — Ambulatory Visit
Admission: EM | Admit: 2019-01-29 | Discharge: 2019-01-29 | Disposition: A | Discharge status: Home / Self Care | Payer: Medicaid Other

## 2019-01-29 ENCOUNTER — Other Ambulatory Visit: Payer: Self-pay

## 2019-01-29 DIAGNOSIS — Z4802 Encounter for removal of sutures: Secondary | ICD-10-CM | POA: Diagnosis not present

## 2019-01-29 DIAGNOSIS — S61012A Laceration without foreign body of left thumb without damage to nail, initial encounter: Secondary | ICD-10-CM | POA: Diagnosis not present

## 2019-01-29 NOTE — ED Triage Notes (Signed)
Pt presents to UC for suture removal in left thumb. Pt states she had them put in 10 days ago and has 6 sutures.

## 2019-02-21 ENCOUNTER — Other Ambulatory Visit: Payer: Medicaid Other

## 2019-02-21 ENCOUNTER — Other Ambulatory Visit: Payer: Self-pay

## 2019-02-21 DIAGNOSIS — Z20828 Contact with and (suspected) exposure to other viral communicable diseases: Secondary | ICD-10-CM | POA: Diagnosis not present

## 2019-02-21 DIAGNOSIS — Z20822 Contact with and (suspected) exposure to covid-19: Secondary | ICD-10-CM

## 2019-02-22 LAB — NOVEL CORONAVIRUS, NAA: SARS-CoV-2, NAA: NOT DETECTED

## 2019-03-30 DIAGNOSIS — Z23 Encounter for immunization: Secondary | ICD-10-CM | POA: Diagnosis not present

## 2019-05-07 DIAGNOSIS — Z23 Encounter for immunization: Secondary | ICD-10-CM | POA: Diagnosis not present

## 2019-10-25 ENCOUNTER — Other Ambulatory Visit: Payer: Medicaid Other

## 2020-02-23 NOTE — L&D Delivery Note (Addendum)
OB/GYN Faculty Practice Delivery Note  Melissa Howell is a 30 y.o. G2P1001 s/p PPD#0 SVD @2236  at [redacted]w[redacted]d. She was admitted for IOL-cHTN with eventual dx of SIPE w/o SF.   ROM: 5h 58m with clear fluid GBS Status: neg Maximum Maternal Temperature: 98.1  Labor Progress: S/p cytotec x1 and FB, as well as AROM prior to progressing to complete.   Delivery Date/Time: 2236 on September 24, 2020 Delivery: Called to room and patient was complete and pushing. Head delivered ROA. No nuchal cord present. Shoulder and body delivered in usual fashion. Infant with spontaneous cry, placed on mother's abdomen, dried and stimulated. Cord clamped x 2 after 1-minute delay, and cut by FOB under my direct supervision. Cord blood drawn. Placenta delivered spontaneously with gentle cord traction. Fundus firm with massage and Pitocin. Labia, perineum, vagina, and cervix were inspected, no tears appreciated.   Placenta: complete, three vessel cord appreciated Complications: cHTN with SIPE (mild)-Procardia 30 Lacerations: None EBL: 200 mL Analgesia: Epidural  Postpartum Planning [X]  message to sent to schedule follow-up   Infant:   APGARs 9, 9  healthy and viable  September 26, 2020, [facility] , [facility]   Patient is a Melissa Howell at [redacted]w[redacted]d who was admitted for IOL with cHTN and eventual dx of SIPE w/o SF (P/C 0.46), significant hx of false positive RPR during prenatal care as well as Rh neg.  She progressed with induction/augmentation via cytotec, cervical foley, and AROM.  I was gloved and present for delivery in its entirety.  Second stage of labor progressed, baby delivered after pushing with one contraction.  No decels during second stage noted.  Complications: none  Lacerations: none  EBL: 200cc  Will continue Procardia postpartum as well as plan on meds-to-beds.  [id number], CNM

## 2020-03-07 DIAGNOSIS — M654 Radial styloid tenosynovitis [de Quervain]: Secondary | ICD-10-CM | POA: Diagnosis not present

## 2020-03-07 DIAGNOSIS — N926 Irregular menstruation, unspecified: Secondary | ICD-10-CM | POA: Diagnosis not present

## 2020-03-07 DIAGNOSIS — E010 Iodine-deficiency related diffuse (endemic) goiter: Secondary | ICD-10-CM | POA: Diagnosis not present

## 2020-03-07 DIAGNOSIS — Z3490 Encounter for supervision of normal pregnancy, unspecified, unspecified trimester: Secondary | ICD-10-CM | POA: Diagnosis not present

## 2020-04-04 DIAGNOSIS — R7989 Other specified abnormal findings of blood chemistry: Secondary | ICD-10-CM | POA: Diagnosis not present

## 2020-04-14 ENCOUNTER — Telehealth: Payer: Medicaid Other

## 2020-04-18 DIAGNOSIS — E010 Iodine-deficiency related diffuse (endemic) goiter: Secondary | ICD-10-CM | POA: Diagnosis not present

## 2020-04-22 ENCOUNTER — Telehealth: Payer: Self-pay

## 2020-04-22 DIAGNOSIS — Z3490 Encounter for supervision of normal pregnancy, unspecified, unspecified trimester: Secondary | ICD-10-CM

## 2020-04-22 NOTE — Telephone Encounter (Signed)
Pt left VM on nurse line requesting a callback to discuss what medication is safe to take for hives during pregnancy. Pt has not been seen in our office, has new OB appt scheduled 05/05/20.

## 2020-04-23 NOTE — Addendum Note (Signed)
Addended by: Jill Side on: 04/23/2020 03:27 PM   Modules accepted: Orders

## 2020-04-23 NOTE — Telephone Encounter (Addendum)
Call returned to pt and discussed her concerns. She reports having hives since January which are getting progressively worse. The hives are on her wrist, @ her waist and bra straps area. They are itchy @ times. Pt further stated that she had this similar problem after her first child was born and the hives were felt to be related to hormones. She had blood work for allergies w/PCP in January and was told she did not have any allergies. I advised pt that she may take Benadryl per package directions if desired. Since she has not been seen by our providers yet, I cannot advise of any other medications. If her problem becomes worse prior to scheduled office visit, she should go to an Urgent Care facility for evaluation. Pt asked about having [place]. She reports approximate LMP 12/31/19 which yields EDD 10/06/20, now [redacted]w[redacted]d. I stated that I will schedule her anatomy US and she will be notified of appt via Mychart. Pt voiced understanding of all information and instructions given.

## 2020-04-24 ENCOUNTER — Telehealth: Payer: Medicaid Other

## 2020-05-05 ENCOUNTER — Encounter: Payer: Self-pay | Admitting: Nurse Practitioner

## 2020-05-05 ENCOUNTER — Ambulatory Visit (INDEPENDENT_AMBULATORY_CARE_PROVIDER_SITE_OTHER): Payer: Medicaid Other | Admitting: Nurse Practitioner

## 2020-05-05 ENCOUNTER — Other Ambulatory Visit (HOSPITAL_COMMUNITY)
Admission: RE | Admit: 2020-05-05 | Discharge: 2020-05-05 | Disposition: A | Discharge status: Home / Self Care | Payer: Medicaid Other | Source: Ambulatory Visit | Attending: Nurse Practitioner | Admitting: Nurse Practitioner

## 2020-05-05 ENCOUNTER — Other Ambulatory Visit: Payer: Self-pay

## 2020-05-05 VITALS — BP 124/78 | HR 105 | Wt 186.7 lb

## 2020-05-05 DIAGNOSIS — Z3490 Encounter for supervision of normal pregnancy, unspecified, unspecified trimester: Secondary | ICD-10-CM | POA: Diagnosis not present

## 2020-05-05 DIAGNOSIS — R21 Rash and other nonspecific skin eruption: Secondary | ICD-10-CM | POA: Insufficient documentation

## 2020-05-05 DIAGNOSIS — Z6791 Unspecified blood type, Rh negative: Secondary | ICD-10-CM

## 2020-05-05 DIAGNOSIS — O26899 Other specified pregnancy related conditions, unspecified trimester: Secondary | ICD-10-CM

## 2020-05-05 MED ORDER — VITAFOL GUMMIES 3.33-0.333-34.8 MG PO CHEW: 3.0000 | CHEWABLE_TABLET | Freq: Every day | ORAL | 11 refills | Status: DC

## 2020-05-05 NOTE — Progress Notes (Addendum)
Subjective:   Melissa Howell is a 30 y.o. G2P1001 at [redacted]w[redacted]d by LMP being seen today for her first obstetrical visit.  Her obstetrical history is significant for allergic rash in pregnancy. Patient does intend to breast feed. Pregnancy history fully reviewed.  Patient reports rash.  HISTORY: OB History  Gravida Para Term Preterm AB Living  2 1 1  0 0 1  SAB IAB Ectopic Multiple Live Births  0 0 0 0 1    # Outcome Date GA Lbr Len/2nd Weight Sex Delivery Anes PTL Lv  2 Current           1 Term 06/01/13 [redacted]w[redacted]d 12:30 / 03:26 8 lb 4.5 oz (3.756 kg) F Vag-Spont EPI  LIV     Name: Rozario,GIRL Tennille     Apgar1: 8  Apgar5: 9   History reviewed. No pertinent past medical history. History reviewed. No pertinent surgical history. Family History  Problem Relation Age of Onset  . Healthy Mother   . Hypertension Mother    Social History   Tobacco Use  . Smoking status: Never Smoker  . Smokeless tobacco: Never Used  Vaping Use  . Vaping Use: Never used  Substance Use Topics  . Alcohol use: Not Currently    Comment: OCC  . Drug use: No   No Known Allergies Current Outpatient Medications on File Prior to Visit  Medication Sig Dispense Refill  . cetirizine (ZYRTEC) 10 MG tablet Take 10 mg by mouth daily as needed for allergies.    . Multiple Vitamins-Minerals (MULTIVITAMIN ADULT) CHEW Chew 2 tablets by mouth. Takes 2 multivitamin gummies     No current facility-administered medications on file prior to visit.     Exam   Vitals:   05/05/20 1553  BP: 124/78  Pulse: (!) 105  Weight: 186 lb 11.2 oz (84.7 kg)   Fetal Heart Rate (bpm): 156  Uterus:  Fundal Height: 23 cm  Pelvic Exam: Perineum: no hemorrhoids, normal perineum   Vulva: normal external genitalia, no lesions   Vagina:  normal mucosa, normal discharge   Cervix: no lesions and normal, pap smear done.    Adnexa: normal adnexa and no mass, fullness, tenderness   Bony Pelvis: average  System: General: well-developed,  well-nourished female in no acute distress   Breast:  normal appearance, no masses or tenderness   Skin: normal coloration and turgor, no rashes   Neurologic: oriented, normal, negative, normal mood   Extremities: normal strength, tone, and muscle mass, ROM of all joints is normal   HEENT extraocular movement intact and sclera clear, anicteric   Mouth/Teeth deferred   Neck supple and no masses, normal thyroid   Cardiovascular: regular rate and rhythm   Respiratory:  no respiratory distress, normal breath sounds   Abdomen: soft, non-tender; no masses,  no organomegaly     Assessment:   Pregnancy: G2P1001 Patient Active Problem List   Diagnosis Date Noted  . Supervision of low-risk pregnancy 05/05/2020  . Rh negative state in antepartum period 05/05/2020  . Rash 05/05/2020  . 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 Encounter for supervision of low-risk pregnancy, antepartum Ultrasound already scheduled - size greater than dates today - EDC may be off - did not order AFP Hx of positive RPR Hx of past LSIL - pap repeated today Has had care at her PCP in [place] BMI 29 Reviewed babyscripts  and MyChart Reviewed that Covid Booster is due and advised to get now.  - CBC/D/Plt+RPR+Rh+ABO+Rub Ab... - Genetic Screening - Culture, OB Urine - CH[facility] AMB BABYSCRIPTS SCHEDULE OPTIMIZATION - Hemoglobin A1c - Cytology - PAP( [place])  2. Rash Has not had since 6 years ago.  Began in pregnancy.  Reviewed common dermatits triggers and she is using appropriate products.  Her dogs do sleep on her bed.  Rash is worst in the mornings and eases some during the day.  Discussed decreased immunity during pregnancy.  Taking zyrtec or benadryl.  Has had one time when her lips swelled as welll.  Is unaware of any dietary triggers.  3. Rh negative state in antepartum period Will plan for rhogam at 28  weeis   Initial labs drawn. Continue prenatal vitamins. Genetic Screening discussed, NIPS: ordered. Ultrasound discussed; fetal anatomic survey: already scheduled. Problem list reviewed and updated. The nature of [facility] with multiple MDs and other Advanced Practice Providers was explained to patient; also emphasized that residents, students are part of our team. Routine obstetric precautions reviewed. Return in about 4 weeks (around 06/02/2020) for in person ROB.  Total face-to-face time with patient: 40 minutes.  Over 50% of encounter was spent on counseling and coordination of care.     Nolene Bernheim, FNP Family Nurse Practitioner, [facility] for [employer], [facility] 05/05/2020 5:03 PM

## 2020-05-05 NOTE — Patient Instructions (Signed)

## 2020-05-06 LAB — CBC/D/PLT+RPR+RH+ABO+RUB AB...
Antibody Screen: NEGATIVE
Basophils Absolute: 0 10*3/uL (ref 0.0–0.2)
Basos: 0 %
EOS (ABSOLUTE): 0 10*3/uL (ref 0.0–0.4)
Eos: 0 %
HCV Ab: <0.1 s/co ratio (ref 0.0–0.9)
HIV Screen 4th Generation wRfx: NONREACTIVE
Hematocrit: 33.6 % — ABNORMAL LOW (ref 34.0–46.6)
Hemoglobin: 11.3 g/dL (ref 11.1–15.9)
Hepatitis B Surface Ag: NEGATIVE
Immature Grans (Abs): 0 10*3/uL (ref 0.0–0.1)
Immature Granulocytes: 0 %
Lymphocytes Absolute: 1.1 10*3/uL (ref 0.7–3.1)
Lymphs: 18 %
MCH: 26.9 pg (ref 26.6–33.0)
MCHC: 33.6 g/dL (ref 31.5–35.7)
MCV: 80 fL (ref 79–97)
Monocytes Absolute: 0.4 10*3/uL (ref 0.1–0.9)
Monocytes: 6 %
Neutrophils Absolute: 4.5 10*3/uL (ref 1.4–7.0)
Neutrophils: 76 %
Platelets: 312 10*3/uL (ref 150–450)
RBC: 4.2 x10E6/uL (ref 3.77–5.28)
RDW: 14.3 % (ref 11.7–15.4)
RPR Ser Ql: REACTIVE — AB
Rubella Antibodies, IGG: 1.31 index (ref >0.99)
WBC: 5.9 10*3/uL (ref 3.4–10.8)

## 2020-05-06 LAB — CYTOLOGY - PAP
Chlamydia: NEGATIVE
Comment: NEGATIVE
Comment: NEGATIVE
Comment: NORMAL
Diagnosis: NEGATIVE
High risk HPV: NEGATIVE
Neisseria Gonorrhea: NEGATIVE

## 2020-05-06 LAB — RPR, QUANT+TP ABS (REFLEX)
Rapid Plasma Reagin, Quant: 1:1 {titer} — ABNORMAL HIGH
T Pallidum Abs: NONREACTIVE

## 2020-05-06 LAB — HEMOGLOBIN A1C
Est. average glucose Bld gHb Est-mCnc: 114 mg/dL
Hgb A1c MFr Bld: 5.6 % (ref 4.8–5.6)

## 2020-05-06 LAB — HCV INTERPRETATION

## 2020-05-06 NOTE — Progress Notes (Signed)
Medicaid Home Form Completed-05/05/20

## 2020-05-08 LAB — CULTURE, OB URINE

## 2020-05-08 LAB — URINE CULTURE, OB REFLEX

## 2020-05-12 ENCOUNTER — Encounter: Payer: Self-pay | Admitting: *Deleted

## 2020-05-15 ENCOUNTER — Encounter: Payer: Self-pay | Admitting: General Practice

## 2020-05-16 ENCOUNTER — Other Ambulatory Visit: Payer: Self-pay

## 2020-05-16 ENCOUNTER — Ambulatory Visit: Payer: Medicaid Other | Attending: Obstetrics and Gynecology

## 2020-05-16 DIAGNOSIS — Z3490 Encounter for supervision of normal pregnancy, unspecified, unspecified trimester: Secondary | ICD-10-CM | POA: Insufficient documentation

## 2020-05-21 ENCOUNTER — Telehealth: Payer: Self-pay | Admitting: Family Medicine

## 2020-05-21 MED ORDER — VITAFOL GUMMIES 3.33-0.333-34.8 MG PO CHEW: 3.0000 | CHEWABLE_TABLET | Freq: Every day | ORAL | 11 refills | Status: DC

## 2020-05-21 NOTE — Addendum Note (Signed)
Addended by: Isabell Jarvis on: 05/21/2020 11:54 AM   Modules accepted: Orders

## 2020-05-21 NOTE — Telephone Encounter (Signed)
Pt called and stated that her prescription was not at the pharmacy today .I looked on AVS and [facility] [place] [facility] [address]    [address] [place] [address]  Phone: [phone]    BUT should go to [facility] in [place] on [address].  [address] [place], [place] [address] Ph# [phone]  Please resend today per pt

## 2020-05-21 NOTE — Telephone Encounter (Signed)
New Rx sent to pharmacy in [place], [place] Pt notified.  Encounter closed   [place], RN  05/21/20.

## 2020-05-30 ENCOUNTER — Encounter: Payer: Self-pay | Admitting: Family Medicine

## 2020-05-30 ENCOUNTER — Other Ambulatory Visit: Payer: Self-pay

## 2020-05-30 ENCOUNTER — Ambulatory Visit (INDEPENDENT_AMBULATORY_CARE_PROVIDER_SITE_OTHER): Payer: Medicaid Other | Admitting: Family Medicine

## 2020-05-30 VITALS — BP 133/97 | HR 102 | Wt 195.1 lb

## 2020-05-30 DIAGNOSIS — O10919 Unspecified pre-existing hypertension complicating pregnancy, unspecified trimester: Secondary | ICD-10-CM | POA: Insufficient documentation

## 2020-05-30 DIAGNOSIS — O1493 Unspecified pre-eclampsia, third trimester: Secondary | ICD-10-CM | POA: Insufficient documentation

## 2020-05-30 DIAGNOSIS — R030 Elevated blood-pressure reading, without diagnosis of hypertension: Secondary | ICD-10-CM | POA: Diagnosis not present

## 2020-05-30 DIAGNOSIS — O119 Pre-existing hypertension with pre-eclampsia, unspecified trimester: Secondary | ICD-10-CM | POA: Insufficient documentation

## 2020-05-30 DIAGNOSIS — Z6791 Unspecified blood type, Rh negative: Secondary | ICD-10-CM

## 2020-05-30 DIAGNOSIS — Z9289 Personal history of other medical treatment: Secondary | ICD-10-CM

## 2020-05-30 DIAGNOSIS — Z3492 Encounter for supervision of normal pregnancy, unspecified, second trimester: Secondary | ICD-10-CM

## 2020-05-30 DIAGNOSIS — O26899 Other specified pregnancy related conditions, unspecified trimester: Secondary | ICD-10-CM

## 2020-05-30 NOTE — Progress Notes (Signed)
   Subjective:  Winona ZAMARIYA NEAL is a 30 y.o. G2P1001 at [redacted]w[redacted]d being seen today for ongoing prenatal care.  She is currently monitored for the following issues for this low-risk pregnancy and has Positive RPR on admission; Supervision of low-risk pregnancy; Rh negative state in antepartum period; Rash; and Elevated BP without diagnosis of hypertension on their problem list.  Patient reports no complaints.  Contractions: Not present. Vag. Bleeding: None.  Movement: Present. Denies leaking of fluid.   The following portions of the patient's history were reviewed and updated as appropriate: allergies, current medications, past family history, past medical history, past social history, past surgical history and problem list. Problem list updated.  Objective:   Vitals:   05/30/20 1009  BP: (!) 133/97  Pulse: (!) 102  Weight: 195 lb 1.6 oz (88.5 kg)    Fetal Status: Fetal Heart Rate (bpm): 156   Movement: Present     General:  Alert, oriented and cooperative. Patient is in no acute distress.  Skin: Skin is warm and dry. No rash noted.   Cardiovascular: Normal heart rate noted  Respiratory: Normal respiratory effort, no problems with respiration noted  Abdomen: Soft, gravid, appropriate for gestational age. Pain/Pressure: Absent     Pelvic: Vag. Bleeding: None     Cervical exam deferred        Extremities: Normal range of motion.  Edema: None  Mental Status: Normal mood and affect. Normal behavior. Normal judgment and thought content.   Urinalysis:      Assessment and Plan:  Pregnancy: G2P1001 at [redacted]w[redacted]d  1. Encounter for supervision of low-risk pregnancy in second trimester BP mild range, see below FHR normal Still undecided about birth control, having a boy a would like a circ  2. Positive RPR on admission t pall ab's neg, titer 1:1  3. Rh negative state in antepartum period Rhogam at 28 weeks  4. Elevated BP without diagnosis of hypertension Review of chart shows patient likely  has dx of cHTN, two elevated BP's found on ED visits in the past five years and is always borderline Baseline labs today She works as a Armed forces operational officer and was advised to check BP once a week at work and call office if >140/90  Preterm labor symptoms and general obstetric precautions including but not limited to vaginal bleeding, contractions, leaking of fluid and fetal movement were reviewed in detail with the patient. Please refer to After Visit Summary for other counseling recommendations.  Return in 4 weeks (on 06/27/2020) for [facility], ob visit.   Venora Maples, MD

## 2020-05-30 NOTE — Patient Instructions (Signed)
 Contraception Choices Contraception, also called birth control, refers to methods or devices that prevent pregnancy. Hormonal methods Contraceptive implant A contraceptive implant is a thin, plastic tube that contains a hormone that prevents pregnancy. It is different from an intrauterine device (IUD). It is inserted into the upper part of the arm by a health care provider. Implants can be effective for up to 3 years. Progestin-only injections Progestin-only injections are injections of progestin, a synthetic form of the hormone progesterone. They are given every 3 months by a health care provider. Birth control pills Birth control pills are pills that contain hormones that prevent pregnancy. They must be taken once a day, preferably at the same time each day. A prescription is needed to use this method of contraception. Birth control patch The birth control patch contains hormones that prevent pregnancy. It is placed on the skin and must be changed once a week for three weeks and removed on the fourth week. A prescription is needed to use this method of contraception. Vaginal ring A vaginal ring contains hormones that prevent pregnancy. It is placed in the vagina for three weeks and removed on the fourth week. After that, the process is repeated with a new ring. A prescription is needed to use this method of contraception. Emergency contraceptive Emergency contraceptives prevent pregnancy after unprotected sex. They come in pill form and can be taken up to 5 days after sex. They work best the sooner they are taken after having sex. Most emergency contraceptives are available without a prescription. This method should not be used as your only form of birth control.   Barrier methods Female condom A female condom is a thin sheath that is worn over the penis during sex. Condoms keep sperm from going inside a woman's body. They can be used with a sperm-killing substance (spermicide) to increase their  effectiveness. They should be thrown away after one use. Female condom A female condom is a soft, loose-fitting sheath that is put into the vagina before sex. The condom keeps sperm from going inside a woman's body. They should be thrown away after one use. Diaphragm A diaphragm is a soft, dome-shaped barrier. It is inserted into the vagina before sex, along with a spermicide. The diaphragm blocks sperm from entering the uterus, and the spermicide kills sperm. A diaphragm should be left in the vagina for 6-8 hours after sex and removed within 24 hours. A diaphragm is prescribed and fitted by a health care provider. A diaphragm should be replaced every 1-2 years, after giving birth, after gaining more than 15 lb (6.8 kg), and after pelvic surgery. Cervical cap A cervical cap is a round, soft latex or plastic cup that fits over the cervix. It is inserted into the vagina before sex, along with spermicide. It blocks sperm from entering the uterus. The cap should be left in place for 6-8 hours after sex and removed within 48 hours. A cervical cap must be prescribed and fitted by a health care provider. It should be replaced every 2 years. Sponge A sponge is a soft, circular piece of polyurethane foam with spermicide in it. The sponge helps block sperm from entering the uterus, and the spermicide kills sperm. To use it, you make it wet and then insert it into the vagina. It should be inserted before sex, left in for at least 6 hours after sex, and removed and thrown away within 30 hours. Spermicides Spermicides are chemicals that kill or block sperm from entering the   cervix and uterus. They can come as a cream, jelly, suppository, foam, or tablet. A spermicide should be inserted into the vagina with an applicator at least 10-15 minutes before sex to allow time for it to work. The process must be repeated every time you have sex. Spermicides do not require a prescription.   Intrauterine  contraception Intrauterine device (IUD) An IUD is a T-shaped device that is put in a woman's uterus. There are two types:  Hormone IUD.This type contains progestin, a synthetic form of the hormone progesterone. This type can stay in place for 3-5 years.  Copper IUD.This type is wrapped in copper wire. It can stay in place for 10 years. Permanent methods of contraception Female tubal ligation In this method, a woman's fallopian tubes are sealed, tied, or blocked during surgery to prevent eggs from traveling to the uterus. Hysteroscopic sterilization In this method, a small, flexible insert is placed into each fallopian tube. The inserts cause scar tissue to form in the fallopian tubes and block them, so sperm cannot reach an egg. The procedure takes about 3 months to be effective. Another form of birth control must be used during those 3 months. Female sterilization This is a procedure to tie off the tubes that carry sperm (vasectomy). After the procedure, the man can still ejaculate fluid (semen). Another form of birth control must be used for 3 months after the procedure. Natural planning methods Natural family planning In this method, a couple does not have sex on days when the woman could become pregnant. Calendar method In this method, the woman keeps track of the length of each menstrual cycle, identifies the days when pregnancy can happen, and does not have sex on those days. Ovulation method In this method, a couple avoids sex during ovulation. Symptothermal method This method involves not having sex during ovulation. The woman typically checks for ovulation by watching changes in her temperature and in the consistency of cervical mucus. Post-ovulation method In this method, a couple waits to have sex until after ovulation. Where to find more information  Centers for Disease Control and Prevention: www.cdc.gov Summary  Contraception, also called birth control, refers to methods or  devices that prevent pregnancy.  Hormonal methods of contraception include implants, injections, pills, patches, vaginal rings, and emergency contraceptives.  Barrier methods of contraception can include female condoms, female condoms, diaphragms, cervical caps, sponges, and spermicides.  There are two types of IUDs (intrauterine devices). An IUD can be put in a woman's uterus to prevent pregnancy for 3-5 years.  Permanent sterilization can be done through a procedure for males and females. Natural family planning methods involve nothaving sex on days when the woman could become pregnant. This information is not intended to replace advice given to you by your health care provider. Make sure you discuss any questions you have with your health care provider. Document Revised: 07/16/2019 Document Reviewed: 07/16/2019 Elsevier Patient Education  2021 [employer] Inc.   Breastfeeding  Choosing to breastfeed is one of the best decisions you can make for yourself and your baby. A change in hormones during pregnancy causes your breasts to make breast milk in your milk-producing glands. Hormones prevent breast milk from being released before your baby is born. They also prompt milk flow after birth. Once breastfeeding has begun, thoughts of your baby, as well as his or her sucking or crying, can stimulate the release of milk from your milk-producing glands. Benefits of breastfeeding Research shows that breastfeeding offers many health benefits   for infants and mothers. It also offers a cost-free and convenient way to feed your baby. For your baby  Your first milk (colostrum) helps your baby's digestive system to function better.  Special cells in your milk (antibodies) help your baby to fight off infections.  Breastfed babies are less likely to develop asthma, allergies, obesity, or type 2 diabetes. They are also at lower risk for sudden infant death syndrome (SIDS).  Nutrients in breast milk are better  able to meet your baby's needs compared to infant formula.  Breast milk improves your baby's brain development. For you  Breastfeeding helps to create a very special bond between you and your baby.  Breastfeeding is convenient. Breast milk costs nothing and is always available at the correct temperature.  Breastfeeding helps to burn calories. It helps you to lose the weight that you gained during pregnancy.  Breastfeeding makes your uterus return faster to its size before pregnancy. It also slows bleeding (lochia) after you give birth.  Breastfeeding helps to lower your risk of developing type 2 diabetes, osteoporosis, rheumatoid arthritis, cardiovascular disease, and breast, ovarian, uterine, and endometrial cancer later in life. Breastfeeding basics Starting breastfeeding  Find a comfortable place to sit or lie down, with your neck and back well-supported.  Place a pillow or a rolled-up blanket under your baby to bring him or her to the level of your breast (if you are seated). Nursing pillows are specially designed to help support your arms and your baby while you breastfeed.  Make sure that your baby's tummy (abdomen) is facing your abdomen.  Gently massage your breast. With your fingertips, massage from the outer edges of your breast inward toward the nipple. This encourages milk flow. If your milk flows slowly, you may need to continue this action during the feeding.  Support your breast with 4 fingers underneath and your thumb above your nipple (make the letter "C" with your hand). Make sure your fingers are well away from your nipple and your baby's mouth.  Stroke your baby's lips gently with your finger or nipple.  When your baby's mouth is open wide enough, quickly bring your baby to your breast, placing your entire nipple and as much of the areola as possible into your baby's mouth. The areola is the colored area around your nipple. ? More areola should be visible above your  baby's upper lip than below the lower lip. ? Your baby's lips should be opened and extended outward (flanged) to ensure an adequate, comfortable latch. ? Your baby's tongue should be between his or her lower gum and your breast.  Make sure that your baby's mouth is correctly positioned around your nipple (latched). Your baby's lips should create a seal on your breast and be turned out (everted).  It is common for your baby to suck about 2-3 minutes in order to start the flow of breast milk. Latching Teaching your baby how to latch onto your breast properly is very important. An improper latch can cause nipple pain, decreased milk supply, and poor weight gain in your baby. Also, if your baby is not latched onto your nipple properly, he or she may swallow some air during feeding. This can make your baby fussy. Burping your baby when you switch breasts during the feeding can help to get rid of the air. However, teaching your baby to latch on properly is still the best way to prevent fussiness from swallowing air while breastfeeding. Signs that your baby has successfully latched onto   your nipple  Silent tugging or silent sucking, without causing you pain. Infant's lips should be extended outward (flanged).  Swallowing heard between every 3-4 sucks once your milk has started to flow (after your let-down milk reflex occurs).  Muscle movement above and in front of his or her ears while sucking. Signs that your baby has not successfully latched onto your nipple  Sucking sounds or smacking sounds from your baby while breastfeeding.  Nipple pain. If you think your baby has not latched on correctly, slip your finger into the corner of your baby's mouth to break the suction and place it between your baby's gums. Attempt to start breastfeeding again. Signs of successful breastfeeding Signs from your baby  Your baby will gradually decrease the number of sucks or will completely stop sucking.  Your baby  will fall asleep.  Your baby's body will relax.  Your baby will retain a small amount of milk in his or her mouth.  Your baby will let go of your breast by himself or herself. Signs from you  Breasts that have increased in firmness, weight, and size 1-3 hours after feeding.  Breasts that are softer immediately after breastfeeding.  Increased milk volume, as well as a change in milk consistency and color by the fifth day of breastfeeding.  Nipples that are not sore, cracked, or bleeding. Signs that your baby is getting enough milk  Wetting at least 1-2 diapers during the first 24 hours after birth.  Wetting at least 5-6 diapers every 24 hours for the first week after birth. The urine should be clear or pale yellow by the age of 5 days.  Wetting 6-8 diapers every 24 hours as your baby continues to grow and develop.  At least 3 stools in a 24-hour period by the age of 5 days. The stool should be soft and yellow.  At least 3 stools in a 24-hour period by the age of 7 days. The stool should be seedy and yellow.  No loss of weight greater than 10% of birth weight during the first 3 days of life.  Average weight gain of 4-7 oz (113-198 g) per week after the age of 4 days.  Consistent daily weight gain by the age of 5 days, without weight loss after the age of 2 weeks. After a feeding, your baby may spit up a small amount of milk. This is normal. Breastfeeding frequency and duration Frequent feeding will help you make more milk and can prevent sore nipples and extremely full breasts (breast engorgement). Breastfeed when you feel the need to reduce the fullness of your breasts or when your baby shows signs of hunger. This is called "breastfeeding on demand." Signs that your baby is hungry include:  Increased alertness, activity, or restlessness.  Movement of the head from side to side.  Opening of the mouth when the corner of the mouth or cheek is stroked (rooting).  Increased  sucking sounds, smacking lips, cooing, sighing, or squeaking.  Hand-to-mouth movements and sucking on fingers or hands.  Fussing or crying. Avoid introducing a pacifier to your baby in the first 4-6 weeks after your baby is born. After this time, you may choose to use a pacifier. Research has shown that pacifier use during the first year of a baby's life decreases the risk of sudden infant death syndrome (SIDS). Allow your baby to feed on each breast as long as he or she wants. When your baby unlatches or falls asleep while feeding from the   first breast, offer the second breast. Because newborns are often sleepy in the first few weeks of life, you may need to awaken your baby to get him or her to feed. Breastfeeding times will vary from baby to baby. However, the following rules can serve as a guide to help you make sure that your baby is properly fed:  Newborns (babies 4 weeks of age or younger) may breastfeed every 1-3 hours.  Newborns should not go without breastfeeding for longer than 3 hours during the day or 5 hours during the night.  You should breastfeed your baby a minimum of 8 times in a 24-hour period. Breast milk pumping Pumping and storing breast milk allows you to make sure that your baby is exclusively fed your breast milk, even at times when you are unable to breastfeed. This is especially important if you go back to work while you are still breastfeeding, or if you are not able to be present during feedings. Your lactation consultant can help you find a method of pumping that works best for you and give you guidelines about how long it is safe to store breast milk.      Caring for your breasts while you breastfeed Nipples can become dry, cracked, and sore while breastfeeding. The following recommendations can help keep your breasts moisturized and healthy:  Avoid using soap on your nipples.  Wear a supportive bra designed especially for nursing. Avoid wearing underwire-style  bras or extremely tight bras (sports bras).  Air-dry your nipples for 3-4 minutes after each feeding.  Use only cotton bra pads to absorb leaked breast milk. Leaking of breast milk between feedings is normal.  Use lanolin on your nipples after breastfeeding. Lanolin helps to maintain your skin's normal moisture barrier. Pure lanolin is not harmful (not toxic) to your baby. You may also hand express a few drops of breast milk and gently massage that milk into your nipples and allow the milk to air-dry. In the first few weeks after giving birth, some women experience breast engorgement. Engorgement can make your breasts feel heavy, warm, and tender to the touch. Engorgement peaks within 3-5 days after you give birth. The following recommendations can help to ease engorgement:  Completely empty your breasts while breastfeeding or pumping. You may want to start by applying warm, moist heat (in the shower or with warm, water-soaked hand towels) just before feeding or pumping. This increases circulation and helps the milk flow. If your baby does not completely empty your breasts while breastfeeding, pump any extra milk after he or she is finished.  Apply ice packs to your breasts immediately after breastfeeding or pumping, unless this is too uncomfortable for you. To do this: ? Put ice in a plastic bag. ? Place a towel between your skin and the bag. ? Leave the ice on for 20 minutes, 2-3 times a day.  Make sure that your baby is latched on and positioned properly while breastfeeding. If engorgement persists after 48 hours of following these recommendations, contact your health care provider or a lactation consultant. Overall health care recommendations while breastfeeding  Eat 3 healthy meals and 3 snacks every day. Well-nourished mothers who are breastfeeding need an additional 450-500 calories a day. You can meet this requirement by increasing the amount of a balanced diet that you eat.  Drink  enough water to keep your urine pale yellow or clear.  Rest often, relax, and continue to take your prenatal vitamins to prevent fatigue, stress, and low   vitamin and mineral levels in your body (nutrient deficiencies).  Do not use any products that contain nicotine or tobacco, such as cigarettes and e-cigarettes. Your baby may be harmed by chemicals from cigarettes that pass into breast milk and exposure to secondhand smoke. If you need help quitting, ask your health care provider.  Avoid alcohol.  Do not use illegal drugs or marijuana.  Talk with your health care provider before taking any medicines. These include over-the-counter and prescription medicines as well as vitamins and herbal supplements. Some medicines that may be harmful to your baby can pass through breast milk.  It is possible to become pregnant while breastfeeding. If birth control is desired, ask your health care provider about options that will be safe while breastfeeding your baby. Where to find more information: [employer]: www.llli.org Contact a health care provider if:  You feel like you want to stop breastfeeding or have become frustrated with breastfeeding.  Your nipples are cracked or bleeding.  Your breasts are red, tender, or warm.  You have: ? Painful breasts or nipples. ? A swollen area on either breast. ? A fever or chills. ? Nausea or vomiting. ? Drainage other than breast milk from your nipples.  Your breasts do not become full before feedings by the fifth day after you give birth.  You feel sad and depressed.  Your baby is: ? Too sleepy to eat well. ? Having trouble sleeping. ? More than 1 week old and wetting fewer than 6 diapers in a 24-hour period. ? Not gaining weight by 5 days of age.  Your baby has fewer than 3 stools in a 24-hour period.  Your baby's skin or the white parts of his or her eyes become yellow. Get help right away if:  Your baby is overly tired  (lethargic) and does not want to wake up and feed.  Your baby develops an unexplained fever. Summary  Breastfeeding offers many health benefits for infant and mothers.  Try to breastfeed your infant when he or she shows early signs of hunger.  Gently tickle or stroke your baby's lips with your finger or nipple to allow the baby to open his or her mouth. Bring the baby to your breast. Make sure that much of the areola is in your baby's mouth. Offer one side and burp the baby before you offer the other side.  Talk with your health care provider or lactation consultant if you have questions or you face problems as you breastfeed. This information is not intended to replace advice given to you by your health care provider. Make sure you discuss any questions you have with your health care provider. Document Revised: 05/05/2017 Document Reviewed: 03/12/2016 Elsevier Patient Education  2021 Elsevier Inc.  

## 2020-05-31 LAB — CBC
Hematocrit: 30.7 % — ABNORMAL LOW (ref 34.0–46.6)
Hemoglobin: 10.2 g/dL — ABNORMAL LOW (ref 11.1–15.9)
MCH: 26.6 pg (ref 26.6–33.0)
MCHC: 33.2 g/dL (ref 31.5–35.7)
MCV: 80 fL (ref 79–97)
Platelets: 270 10*3/uL (ref 150–450)
RBC: 3.83 x10E6/uL (ref 3.77–5.28)
RDW: 14.4 % (ref 11.7–15.4)
WBC: 4.8 10*3/uL (ref 3.4–10.8)

## 2020-05-31 LAB — COMPREHENSIVE METABOLIC PANEL
ALT: 10 IU/L (ref 0–32)
AST: 13 IU/L (ref 0–40)
Albumin/Globulin Ratio: 1.6 (ref 1.2–2.2)
Albumin: 3.9 g/dL (ref 3.8–4.8)
Alkaline Phosphatase: 74 IU/L (ref 44–121)
BUN/Creatinine Ratio: 11 (ref 9–23)
BUN: 6 mg/dL (ref 6–20)
Bilirubin Total: <0.2 mg/dL (ref 0.0–1.2)
CO2: 19 mmol/L — ABNORMAL LOW (ref 20–29)
Calcium: 9.1 mg/dL (ref 8.7–10.2)
Chloride: 103 mmol/L (ref 96–106)
Creatinine, Ser: 0.55 mg/dL — ABNORMAL LOW (ref 0.57–1.00)
Globulin, Total: 2.5 g/dL (ref 1.5–4.5)
Glucose: 80 mg/dL (ref 65–99)
Potassium: 4 mmol/L (ref 3.5–5.2)
Sodium: 139 mmol/L (ref 134–144)
Total Protein: 6.4 g/dL (ref 6.0–8.5)
eGFR: 126 mL/min/{1.73_m2} (ref >59)

## 2020-05-31 LAB — PROTEIN / CREATININE RATIO, URINE
Creatinine, Urine: 35.4 mg/dL
Protein, Ur: 5.4 mg/dL
Protein/Creat Ratio: 153 mg/g creat (ref 0–200)

## 2020-07-04 ENCOUNTER — Ambulatory Visit (INDEPENDENT_AMBULATORY_CARE_PROVIDER_SITE_OTHER): Payer: Medicaid Other | Admitting: Medical

## 2020-07-04 ENCOUNTER — Other Ambulatory Visit: Payer: Self-pay

## 2020-07-04 ENCOUNTER — Encounter: Payer: Self-pay | Admitting: Medical

## 2020-07-04 ENCOUNTER — Encounter: Payer: Self-pay | Admitting: General Practice

## 2020-07-04 VITALS — BP 138/81 | HR 112 | Wt 201.0 lb

## 2020-07-04 DIAGNOSIS — Z3A26 26 weeks gestation of pregnancy: Secondary | ICD-10-CM

## 2020-07-04 DIAGNOSIS — Z6791 Unspecified blood type, Rh negative: Secondary | ICD-10-CM

## 2020-07-04 DIAGNOSIS — O26899 Other specified pregnancy related conditions, unspecified trimester: Secondary | ICD-10-CM

## 2020-07-04 DIAGNOSIS — R030 Elevated blood-pressure reading, without diagnosis of hypertension: Secondary | ICD-10-CM

## 2020-07-04 DIAGNOSIS — Z23 Encounter for immunization: Secondary | ICD-10-CM | POA: Diagnosis not present

## 2020-07-04 DIAGNOSIS — Z3492 Encounter for supervision of normal pregnancy, unspecified, second trimester: Secondary | ICD-10-CM

## 2020-07-04 NOTE — Progress Notes (Signed)
   PRENATAL VISIT NOTE  Subjective:  Melissa Howell is a 30 y.o. G2P1001 at [redacted]w[redacted]d being seen today for ongoing prenatal care.  She is currently monitored for the following issues for this low-risk pregnancy and has Positive RPR on admission; Supervision of low-risk pregnancy; Rh negative state in antepartum period; Rash; and Elevated BP without diagnosis of hypertension on their problem list.  Patient reports no complaints.  Contractions: Not present. Vag. Bleeding: None.  Movement: Present. Denies leaking of fluid.   The following portions of the patient's history were reviewed and updated as appropriate: allergies, current medications, past family history, past medical history, past social history, past surgical history and problem list.   Objective:   Vitals:   07/04/20 0941  BP: 138/81  Pulse: (!) 112  Weight: 201 lb (91.2 kg)    Fetal Status: Fetal Heart Rate (bpm): 150 Fundal Height: 27 cm Movement: Present     General:  Alert, oriented and cooperative. Patient is in no acute distress.  Skin: Skin is warm and dry. No rash noted.   Cardiovascular: Normal heart rate noted  Respiratory: Normal respiratory effort, no problems with respiration noted  Abdomen: Soft, gravid, appropriate for gestational age.  Pain/Pressure: Present     Pelvic: Cervical exam deferred        Extremities: Normal range of motion.  Edema: None  Mental Status: Normal mood and affect. Normal behavior. Normal judgment and thought content.   Assessment and Plan:  Pregnancy: G2P1001 at [redacted]w[redacted]d 1. Encounter for supervision of low-risk pregnancy in second trimester - Tdap vaccine greater than or equal to 7yo IM - Anticipatory guidance for GTT, CBC, HIV and RPR at next visit discussed  - Peds is [facility]   2. Rh negative state in antepartum period - Rhogam at [place]  3. Elevated BP without diagnosis of hypertension - Normotensive today, continue to monitor - Warning signs for worsening HTN reviewed   4. [redacted]  weeks gestation of pregnancy  Preterm labor symptoms and general obstetric precautions including but not limited to vaginal bleeding, contractions, leaking of fluid and fetal movement were reviewed in detail with the patient. Please refer to After Visit Summary for other counseling recommendations.   Return in about 2 weeks (around 07/18/2020) for LOB, 28 week labs (fasting), In-Person.  Future Appointments  Date Time Provider Department Center  07/18/2020  8:20 AM [facility]  07/18/2020  8:45 AM Kathlene Cote [facility]    Vonzella Nipple, PA-C

## 2020-07-04 NOTE — Patient Instructions (Signed)
Fetal Movement Counts Patient Name: ________________________________________________ Patient Due Date: ____________________  What is a fetal movement count? A fetal movement count is the number of times that you feel your baby move during a certain amount of time. This may also be called a fetal kick count. A fetal movement count is recommended for every pregnant woman. You may be asked to start counting fetal movements as early as week 28 of your pregnancy. Pay attention to when your baby is most active. You may notice your baby's sleep and wake cycles. You may also notice things that make your baby move more. You should do a fetal movement count:  When your baby is normally most active.  At the same time each day. A good time to count movements is while you are resting, after having something to eat and drink. How do I count fetal movements? 1. Find a quiet, comfortable area. Sit, or lie down on your side. 2. Write down the date, the start time and stop time, and the number of movements that you felt between those two times. Take this information with you to your health care visits. 3. Write down your start time when you feel the first movement. 4. Count kicks, flutters, swishes, rolls, and jabs. You should feel at least 10 movements. 5. You may stop counting after you have felt 10 movements, or if you have been counting for 2 hours. Write down the stop time. 6. If you do not feel 10 movements in 2 hours, contact your health care provider for further instructions. Your health care provider may want to do additional tests to assess your baby's well-being. Contact a health care provider if:  You feel fewer than 10 movements in 2 hours.  Your baby is not moving like he or she usually does. Date: ____________ Start time: ____________ Stop time: ____________ Movements: ____________ Date: ____________ Start time: ____________ Stop time: ____________ Movements: ____________ Date: ____________  Start time: ____________ Stop time: ____________ Movements: ____________ Date: ____________ Start time: ____________ Stop time: ____________ Movements: ____________ Date: ____________ Start time: ____________ Stop time: ____________ Movements: ____________ Date: ____________ Start time: ____________ Stop time: ____________ Movements: ____________ Date: ____________ Start time: ____________ Stop time: ____________ Movements: ____________ Date: ____________ Start time: ____________ Stop time: ____________ Movements: ____________ Date: ____________ Start time: ____________ Stop time: ____________ Movements: ____________ This information is not intended to replace advice given to you by your health care provider. Make sure you discuss any questions you have with your health care provider. Document Revised: 09/28/2018 Document Reviewed: 09/28/2018 Elsevier Patient Education  2021 [employer] Rosen's Emergency Medicine: Concepts and Clinical Practice (9th ed., pp. 2296- 2312). Elsevier.">    Braxton Hicks Contractions Contractions of the uterus can occur throughout pregnancy, but they are not always a sign that you are in labor. You may have practice contractions called Braxton Hicks contractions. These false labor contractions are sometimes confused with true labor. What are Braxton Hicks contractions? Braxton Hicks contractions are tightening movements that occur in the muscles of the uterus before labor. Unlike true labor contractions, these contractions do not result in opening (dilation) and thinning of the cervix. Toward the end of pregnancy (32-34 weeks), Braxton Hicks contractions can happen more often and may become stronger. These contractions are sometimes difficult to tell apart from true labor because they can be very uncomfortable. You should not feel embarrassed if you go to the hospital with false labor. Sometimes, the only way to tell if you are in true labor is   for your health care  provider to look for changes in the cervix. The health care provider will do a physical exam and may monitor your contractions. If you are not in true labor, the exam should show that your cervix is not dilating and your water has not broken. If there are no other health problems associated with your pregnancy, it is completely safe for you to be sent home with false labor. You may continue to have Braxton Hicks contractions until you go into true labor. How to tell the difference between true labor and false labor True labor  Contractions last 30-70 seconds.  Contractions become very regular.  Discomfort is usually felt in the top of the uterus, and it spreads to the lower abdomen and low back.  Contractions do not go away with walking.  Contractions usually become more intense and increase in frequency.  The cervix dilates and gets thinner. False labor  Contractions are usually shorter and not as strong as true labor contractions.  Contractions are usually irregular.  Contractions are often felt in the front of the lower abdomen and in the groin.  Contractions may go away when you walk around or change positions while lying down.  Contractions get weaker and are shorter-lasting as time goes on.  The cervix usually does not dilate or become thin. Follow these instructions at home:  Take over-the-counter and prescription medicines only as told by your health care provider.  Keep up with your usual exercises and follow other instructions from your health care provider.  Eat and drink lightly if you think you are going into labor.  If Braxton Hicks contractions are making you uncomfortable: ? Change your position from lying down or resting to walking, or change from walking to resting. ? Sit and rest in a tub of warm water. ? Drink enough fluid to keep your urine pale yellow. Dehydration may cause these contractions. ? Do slow and deep breathing several times an hour.  Keep  all follow-up prenatal visits as told by your health care provider. This is important.   Contact a health care provider if:  You have a fever.  You have continuous pain in your abdomen. Get help right away if:  Your contractions become stronger, more regular, and closer together.  You have fluid leaking or gushing from your vagina.  You pass blood-tinged mucus (bloody show).  You have bleeding from your vagina.  You have low back pain that you never had before.  You feel your baby's head pushing down and causing pelvic pressure.  Your baby is not moving inside you as much as it used to. Summary  Contractions that occur before labor are called Braxton Hicks contractions, false labor, or practice contractions.  Braxton Hicks contractions are usually shorter, weaker, farther apart, and less regular than true labor contractions. True labor contractions usually become progressively stronger and regular, and they become more frequent.  Manage discomfort from Braxton Hicks contractions by changing position, resting in a warm bath, drinking plenty of water, or practicing deep breathing. This information is not intended to replace advice given to you by your health care provider. Make sure you discuss any questions you have with your health care provider. Document Revised: 01/21/2017 Document Reviewed: 06/24/2016 Elsevier Patient Education  2021 Elsevier Inc.  

## 2020-07-11 ENCOUNTER — Encounter: Payer: Self-pay | Admitting: Medical

## 2020-07-14 ENCOUNTER — Other Ambulatory Visit: Payer: Self-pay | Admitting: General Practice

## 2020-07-14 DIAGNOSIS — Z3493 Encounter for supervision of normal pregnancy, unspecified, third trimester: Secondary | ICD-10-CM

## 2020-07-18 ENCOUNTER — Encounter: Payer: Self-pay | Admitting: Medical

## 2020-07-18 ENCOUNTER — Other Ambulatory Visit: Payer: Self-pay

## 2020-07-18 ENCOUNTER — Ambulatory Visit (INDEPENDENT_AMBULATORY_CARE_PROVIDER_SITE_OTHER): Payer: Medicaid Other | Admitting: Medical

## 2020-07-18 ENCOUNTER — Other Ambulatory Visit: Payer: Medicaid Other

## 2020-07-18 VITALS — BP 116/79 | HR 137 | Wt 206.9 lb

## 2020-07-18 DIAGNOSIS — Z3A28 28 weeks gestation of pregnancy: Secondary | ICD-10-CM

## 2020-07-18 DIAGNOSIS — O26899 Other specified pregnancy related conditions, unspecified trimester: Secondary | ICD-10-CM | POA: Diagnosis not present

## 2020-07-18 DIAGNOSIS — Z6791 Unspecified blood type, Rh negative: Secondary | ICD-10-CM

## 2020-07-18 DIAGNOSIS — Z3493 Encounter for supervision of normal pregnancy, unspecified, third trimester: Secondary | ICD-10-CM

## 2020-07-18 MED ORDER — RHO D IMMUNE GLOBULIN 1500 UNIT/2ML IJ SOSY
300.0000 ug | PREFILLED_SYRINGE | Freq: Once | INTRAMUSCULAR | Status: AC
  Administered 2020-07-18: 300 ug via INTRAMUSCULAR

## 2020-07-18 NOTE — Progress Notes (Signed)
   PRENATAL VISIT NOTE  Subjective:  Melissa Howell is a 30 y.o. G2P1001 at [redacted]w[redacted]d being seen today for ongoing prenatal care.  She is currently monitored for the following issues for this low-risk pregnancy and has Positive RPR on admission; Supervision of low-risk pregnancy; Rh negative state in antepartum period; Rash; and Elevated BP without diagnosis of hypertension on their problem list.  Patient reports no complaints.  Contractions: Irritability. Vag. Bleeding: None.  Movement: Present. Denies leaking of fluid.   The following portions of the patient's history were reviewed and updated as appropriate: allergies, current medications, past family history, past medical history, past social history, past surgical history and problem list.   Objective:   Vitals:   07/18/20 0842  BP: 116/79  Pulse: (!) 137  Weight: 206 lb 14.4 oz (93.8 kg)    Fetal Status: Fetal Heart Rate (bpm): 147   Movement: Present     General:  Alert, oriented and cooperative. Patient is in no acute distress.  Skin: Skin is warm and dry. No rash noted.   Cardiovascular: Normal heart rate noted  Respiratory: Normal respiratory effort, no problems with respiration noted  Abdomen: Soft, gravid, appropriate for gestational age.  Pain/Pressure: Absent     Pelvic: Cervical exam deferred        Extremities: Normal range of motion.  Edema: None  Mental Status: Normal mood and affect. Normal behavior. Normal judgment and thought content.   Assessment and Plan:  Pregnancy: G2P1001 at [redacted]w[redacted]d 1. Encounter for supervision of low-risk pregnancy in third trimester - Ate prior to arrival, unable to collect GTT today  - CBC, RPR, HIV and Antibody screen collected  - Had TDAP 07/04/20  2. Rh negative state in antepartum period - rho (d) immune globulin (RHIG/RHOPHYLAC) injection 300 mcg  3. Braxton hicks contractions  - Tightening associated with activity at work and full bladder - Discussed abdominal binder for relief    4.[redacted] weeks gestation   Preterm labor symptoms and general obstetric precautions including but not limited to vaginal bleeding, contractions, leaking of fluid and fetal movement were reviewed in detail with the patient. Please refer to After Visit Summary for other counseling recommendations.   Return in about 2 weeks (around 08/01/2020) for LOB, 28 week labs (fasting), In-Person, any provider.  No future appointments.  Vonzella Nipple, PA-C

## 2020-07-18 NOTE — Progress Notes (Signed)
Patient complains of lower abdominal "discomfort". She stated that her stomach would get "tight" but would not hurt and started a few weeks ago. Denies any vaginal bleeding and feels baby move everyday.

## 2020-07-18 NOTE — Patient Instructions (Addendum)
Fetal Movement Counts Patient Name: ________________________________________________ Patient Due Date: ____________________  What is a fetal movement count? A fetal movement count is the number of times that you feel your baby move during a certain amount of time. This may also be called a fetal kick count. A fetal movement count is recommended for every pregnant woman. You may be asked to start counting fetal movements as early as week 28 of your pregnancy. Pay attention to when your baby is most active. You may notice your baby's sleep and wake cycles. You may also notice things that make your baby move more. You should do a fetal movement count:  When your baby is normally most active.  At the same time each day. A good time to count movements is while you are resting, after having something to eat and drink. How do I count fetal movements? 1. Find a quiet, comfortable area. Sit, or lie down on your side. 2. Write down the date, the start time and stop time, and the number of movements that you felt between those two times. Take this information with you to your health care visits. 3. Write down your start time when you feel the first movement. 4. Count kicks, flutters, swishes, rolls, and jabs. You should feel at least 10 movements. 5. You may stop counting after you have felt 10 movements, or if you have been counting for 2 hours. Write down the stop time. 6. If you do not feel 10 movements in 2 hours, contact your health care provider for further instructions. Your health care provider may want to do additional tests to assess your baby's well-being. Contact a health care provider if:  You feel fewer than 10 movements in 2 hours.  Your baby is not moving like he or she usually does. Date: ____________ Start time: ____________ Stop time: ____________ Movements: ____________ Date: ____________ Start time: ____________ Stop time: ____________ Movements: ____________ Date: ____________  Start time: ____________ Stop time: ____________ Movements: ____________ Date: ____________ Start time: ____________ Stop time: ____________ Movements: ____________ Date: ____________ Start time: ____________ Stop time: ____________ Movements: ____________ Date: ____________ Start time: ____________ Stop time: ____________ Movements: ____________ Date: ____________ Start time: ____________ Stop time: ____________ Movements: ____________ Date: ____________ Start time: ____________ Stop time: ____________ Movements: ____________ Date: ____________ Start time: ____________ Stop time: ____________ Movements: ____________ This information is not intended to replace advice given to you by your health care provider. Make sure you discuss any questions you have with your health care provider. Document Revised: 09/28/2018 Document Reviewed: 09/28/2018 Elsevier Patient Education  2021 [employer] Inc. Rosen's Emergency Medicine: Concepts and Clinical Practice (9th ed., pp. 2296- 2312). Elsevier.">  Braxton Hicks Contractions Contractions of the uterus can occur throughout pregnancy, but they are not always a sign that you are in labor. You may have practice contractions called Braxton Hicks contractions. These false labor contractions are sometimes confused with true labor. What are Braxton Hicks contractions? Braxton Hicks contractions are tightening movements that occur in the muscles of the uterus before labor. Unlike true labor contractions, these contractions do not result in opening (dilation) and thinning of the cervix. Toward the end of pregnancy (32-34 weeks), Braxton Hicks contractions can happen more often and may become stronger. These contractions are sometimes difficult to tell apart from true labor because they can be very uncomfortable. You should not feel embarrassed if you go to the hospital with false labor. Sometimes, the only way to tell if you are in true labor is for your   health care  provider to look for changes in the cervix. The health care provider will do a physical exam and may monitor your contractions. If you are not in true labor, the exam should show that your cervix is not dilating and your water has not broken. If there are no other health problems associated with your pregnancy, it is completely safe for you to be sent home with false labor. You may continue to have Braxton Hicks contractions until you go into true labor. How to tell the difference between true labor and false labor True labor  Contractions last 30-70 seconds.  Contractions become very regular.  Discomfort is usually felt in the top of the uterus, and it spreads to the lower abdomen and low back.  Contractions do not go away with walking.  Contractions usually become more intense and increase in frequency.  The cervix dilates and gets thinner. False labor  Contractions are usually shorter and not as strong as true labor contractions.  Contractions are usually irregular.  Contractions are often felt in the front of the lower abdomen and in the groin.  Contractions may go away when you walk around or change positions while lying down.  Contractions get weaker and are shorter-lasting as time goes on.  The cervix usually does not dilate or become thin. Follow these instructions at home:  Take over-the-counter and prescription medicines only as told by your health care provider.  Keep up with your usual exercises and follow other instructions from your health care provider.  Eat and drink lightly if you think you are going into labor.  If Braxton Hicks contractions are making you uncomfortable: ? Change your position from lying down or resting to walking, or change from walking to resting. ? Sit and rest in a tub of warm water. ? Drink enough fluid to keep your urine pale yellow. Dehydration may cause these contractions. ? Do slow and deep breathing several times an hour.  Keep  all follow-up prenatal visits as told by your health care provider. This is important.   Contact a health care provider if:  You have a fever.  You have continuous pain in your abdomen. Get help right away if:  Your contractions become stronger, more regular, and closer together.  You have fluid leaking or gushing from your vagina.  You pass blood-tinged mucus (bloody show).  You have bleeding from your vagina.  You have low back pain that you never had before.  You feel your baby's head pushing down and causing pelvic pressure.  Your baby is not moving inside you as much as it used to. Summary  Contractions that occur before labor are called Braxton Hicks contractions, false labor, or practice contractions.  Braxton Hicks contractions are usually shorter, weaker, farther apart, and less regular than true labor contractions. True labor contractions usually become progressively stronger and regular, and they become more frequent.  Manage discomfort from Braxton Hicks contractions by changing position, resting in a warm bath, drinking plenty of water, or practicing deep breathing. This information is not intended to replace advice given to you by your health care provider. Make sure you discuss any questions you have with your health care provider. Document Revised: 01/21/2017 Document Reviewed: 06/24/2016 Elsevier Patient Education  2021 Elsevier Inc.   Safe Medications in Pregnancy   Acne:  Benzoyl Peroxide  Salicylic Acid   Backache/Headache:  Tylenol: 2 regular strength every 4 hours OR        2 Extra strength   every 6 hours   Colds/Coughs/Allergies:  Benadryl (alcohol free) 25 mg every 6 hours as needed  Breath right strips  Claritin  Cepacol throat lozenges  Chloraseptic throat spray  Cold-Eeze- up to three times per day  Cough drops, alcohol free  Flonase (by prescription only)  Guaifenesin  Mucinex  Robitussin DM (plain only, alcohol free)  Saline  nasal spray/drops  Sudafed (pseudoephedrine) & Actifed * use only after [redacted] weeks gestation and if you do not have high blood pressure  Tylenol  Vicks Vaporub  Zinc lozenges  Zyrtec   Constipation:  Colace  Ducolax suppositories  Fleet enema  Glycerin suppositories  Metamucil  Milk of magnesia  Miralax  Senokot  Smooth move tea   Diarrhea:  Kaopectate  Imodium A-D   *NO pepto Bismol   Hemorrhoids:  Anusol  Anusol HC  Preparation H  Tucks   Indigestion:  Tums  Maalox  Mylanta  Zantac  Pepcid   Insomnia:  Benadryl (alcohol free) 25mg every 6 hours as needed  Tylenol PM  Unisom, no Gelcaps   Leg Cramps:  Tums  MagGel   Nausea/Vomiting:  Bonine  Dramamine  Emetrol  Ginger extract  Sea bands  Meclizine  Nausea medication to take during pregnancy:  Unisom (doxylamine succinate 25 mg tablets) Take one tablet daily at bedtime. If symptoms are not adequately controlled, the dose can be increased to a maximum recommended dose of two tablets daily (1/2 tablet in the morning, 1/2 tablet mid-afternoon and one at bedtime).  Vitamin B6 100mg tablets. Take one tablet twice a day (up to 200 mg per day).   Skin Rashes:  Aveeno products  Benadryl cream or 25mg every 6 hours as needed  Calamine Lotion  1% cortisone cream   Yeast infection:  Gyne-lotrimin 7  Monistat 7    **If taking multiple medications, please check labels to avoid duplicating the same active ingredients  **take medication as directed on the label  ** Do not exceed 4000 mg of tylenol in 24 hours  **Do not take medications that contain aspirin or ibuprofen           

## 2020-07-19 LAB — ANTIBODY SCREEN: Antibody Screen: NEGATIVE

## 2020-07-25 ENCOUNTER — Telehealth: Payer: Self-pay

## 2020-07-25 NOTE — Telephone Encounter (Signed)
Incoming call from Elfredia Nevins at [facility] regarding recent positive RPR through [facility]. Requesting a call back. Phone number: [phone].

## 2020-07-26 LAB — RPR, QUANT+TP ABS (REFLEX)
Rapid Plasma Reagin, Quant: 1:2 {titer} — ABNORMAL HIGH
T Pallidum Abs: NONREACTIVE

## 2020-07-26 LAB — CBC
Hematocrit: 30 % — ABNORMAL LOW (ref 34.0–46.6)
Hemoglobin: 9.5 g/dL — ABNORMAL LOW (ref 11.1–15.9)
MCH: 24.5 pg — ABNORMAL LOW (ref 26.6–33.0)
MCHC: 31.7 g/dL (ref 31.5–35.7)
MCV: 78 fL — ABNORMAL LOW (ref 79–97)
Platelets: 281 10*3/uL (ref 150–450)
RBC: 3.87 x10E6/uL (ref 3.77–5.28)
RDW: 15.1 % (ref 11.7–15.4)
WBC: 5.6 10*3/uL (ref 3.4–10.8)

## 2020-07-26 LAB — RPR: RPR Ser Ql: REACTIVE — AB

## 2020-07-26 LAB — HIV ANTIBODY (ROUTINE TESTING W REFLEX): HIV Screen 4th Generation wRfx: NONREACTIVE

## 2020-07-28 ENCOUNTER — Encounter: Payer: Self-pay | Admitting: Medical

## 2020-07-28 ENCOUNTER — Other Ambulatory Visit: Payer: Self-pay | Admitting: Medical

## 2020-07-28 DIAGNOSIS — O99013 Anemia complicating pregnancy, third trimester: Secondary | ICD-10-CM

## 2020-07-28 MED ORDER — FERROUS SULFATE 325 (65 FE) MG PO TABS
325.0000 mg | ORAL_TABLET | ORAL | 11 refills | Status: DC
Start: 2020-07-28 — End: 2020-08-08

## 2020-07-28 NOTE — Telephone Encounter (Signed)
Returned call to [facility]; explained that T Pallidum Abs lab was added on 07/18/20 following reactive RPR and result was Non Reactive.

## 2020-08-08 ENCOUNTER — Ambulatory Visit (INDEPENDENT_AMBULATORY_CARE_PROVIDER_SITE_OTHER): Payer: Medicaid Other | Admitting: Family Medicine

## 2020-08-08 ENCOUNTER — Other Ambulatory Visit: Payer: Self-pay

## 2020-08-08 ENCOUNTER — Other Ambulatory Visit: Payer: Medicaid Other

## 2020-08-08 VITALS — BP 130/86 | HR 110 | Wt 208.5 lb

## 2020-08-08 DIAGNOSIS — Z3493 Encounter for supervision of normal pregnancy, unspecified, third trimester: Secondary | ICD-10-CM | POA: Diagnosis not present

## 2020-08-08 DIAGNOSIS — O99013 Anemia complicating pregnancy, third trimester: Secondary | ICD-10-CM

## 2020-08-08 DIAGNOSIS — R030 Elevated blood-pressure reading, without diagnosis of hypertension: Secondary | ICD-10-CM

## 2020-08-08 DIAGNOSIS — Z6791 Unspecified blood type, Rh negative: Secondary | ICD-10-CM

## 2020-08-08 DIAGNOSIS — O26899 Other specified pregnancy related conditions, unspecified trimester: Secondary | ICD-10-CM

## 2020-08-08 DIAGNOSIS — Z9289 Personal history of other medical treatment: Secondary | ICD-10-CM

## 2020-08-08 MED ORDER — FERROUS GLUCONATE 324 (38 FE) MG PO TABS
324.0000 mg | ORAL_TABLET | ORAL | 3 refills | Status: DC
Start: 2020-08-08 — End: 2020-09-26

## 2020-08-08 NOTE — Progress Notes (Signed)
   Subjective:  Melissa Howell is a 30 y.o. G2P1001 at [redacted]w[redacted]d being seen today for ongoing prenatal care.  She is currently monitored for the following issues for this high-risk pregnancy and has False positive RPR; Supervision of low-risk pregnancy; Rh negative state in antepartum period; Rash; Elevated BP without diagnosis of hypertension; and Anemia during pregnancy in third trimester on their problem list.  Patient reports no complaints.  Contractions: Irritability. Vag. Bleeding: None.  Movement: Present. Denies leaking of fluid.   The following portions of the patient's history were reviewed and updated as appropriate: allergies, current medications, past family history, past medical history, past social history, past surgical history and problem list. Problem list updated.  Objective:   Vitals:   08/08/20 0908  BP: 130/86  Pulse: (!) 110  Weight: 208 lb 8 oz (94.6 kg)    Fetal Status: Fetal Heart Rate (bpm): 135   Movement: Present     General:  Alert, oriented and cooperative. Patient is in no acute distress.  Skin: Skin is warm and dry. No rash noted.   Cardiovascular: Normal heart rate noted  Respiratory: Normal respiratory effort, no problems with respiration noted  Abdomen: Soft, gravid, appropriate for gestational age. Pain/Pressure: Present     Pelvic: Vag. Bleeding: None     Cervical exam deferred        Extremities: Normal range of motion.  Edema: None  Mental Status: Normal mood and affect. Normal behavior. Normal judgment and thought content.   Urinalysis:      Assessment and Plan:  Pregnancy: G2P1001 at [redacted]w[redacted]d  1. Encounter for supervision of low-risk pregnancy in third trimester BP and FHR normal Discussed contraception, has tried OCP's and nexplanon and did not like them. Unsure about having more kids, discussed post placental IUD, she will consider Borderline FH at 33 cm, watch closely  2. Rh negative state in antepartum period S/p rhogam 07/18/2020  3.  Elevated BP without diagnosis of hypertension BP 133/97 at 21 weeks, normotensive today  4. Anemia during pregnancy in third trimester Hgb 9.5 on third trimester labs Reports nausea with PO iron pills, trial ferrous gluconate to see if better tolerated  5. False positive RPR Neg T pall Ab's  Preterm labor symptoms and general obstetric precautions including but not limited to vaginal bleeding, contractions, leaking of fluid and fetal movement were reviewed in detail with the patient. Please refer to After Visit Summary for other counseling recommendations.  Return in 2 weeks (on 08/22/2020).   Venora Maples, MD

## 2020-08-08 NOTE — Patient Instructions (Signed)

## 2020-08-09 LAB — GLUCOSE TOLERANCE, 2 HOURS W/ 1HR
Glucose, 1 hour: 121 mg/dL (ref 65–179)
Glucose, 2 hour: 120 mg/dL (ref 65–152)
Glucose, Fasting: 83 mg/dL (ref 65–91)

## 2020-09-01 ENCOUNTER — Ambulatory Visit (INDEPENDENT_AMBULATORY_CARE_PROVIDER_SITE_OTHER): Payer: Medicaid Other | Admitting: Obstetrics and Gynecology

## 2020-09-01 ENCOUNTER — Other Ambulatory Visit: Payer: Self-pay

## 2020-09-01 VITALS — BP 134/91 | HR 105 | Wt 212.7 lb

## 2020-09-01 DIAGNOSIS — O26849 Uterine size-date discrepancy, unspecified trimester: Secondary | ICD-10-CM | POA: Insufficient documentation

## 2020-09-01 DIAGNOSIS — O26899 Other specified pregnancy related conditions, unspecified trimester: Secondary | ICD-10-CM

## 2020-09-01 DIAGNOSIS — Z3A35 35 weeks gestation of pregnancy: Secondary | ICD-10-CM | POA: Insufficient documentation

## 2020-09-01 DIAGNOSIS — Z3493 Encounter for supervision of normal pregnancy, unspecified, third trimester: Secondary | ICD-10-CM

## 2020-09-01 DIAGNOSIS — Z9289 Personal history of other medical treatment: Secondary | ICD-10-CM

## 2020-09-01 DIAGNOSIS — Z6791 Unspecified blood type, Rh negative: Secondary | ICD-10-CM

## 2020-09-01 NOTE — Progress Notes (Signed)
   PRENATAL VISIT NOTE  Subjective:  Melissa Howell is a 30 y.o. G2P1001 at [redacted]w[redacted]d being seen today for ongoing prenatal care.  She is currently monitored for the following issues for this low-risk pregnancy and has False positive RPR; Supervision of low-risk pregnancy; Rh negative state in antepartum period; Rash; Elevated BP without diagnosis of hypertension; Anemia during pregnancy in third trimester; [redacted] weeks gestation of pregnancy; and Size of fetus inconsistent with dates, antepartum on their problem list.  Patient doing well with no acute concerns today. She reports no complaints.  Contractions: Not present. Vag. Bleeding: None.  Movement: Present. Denies leaking of fluid.   The following portions of the patient's history were reviewed and updated as appropriate: allergies, current medications, past family history, past medical history, past social history, past surgical history and problem list. Problem list updated.  Objective:   Vitals:   09/01/20 1027  BP: (!) 134/91  Pulse: (!) 105  Weight: 212 lb 11.2 oz (96.5 kg)    Fetal Status: Fetal Heart Rate (bpm): 139 Fundal Height: 38 cm Movement: Present     General:  Alert, oriented and cooperative. Patient is in no acute distress.  Skin: Skin is warm and dry. No rash noted.   Cardiovascular: Normal heart rate noted  Respiratory: Normal respiratory effort, no problems with respiration noted  Abdomen: Soft, gravid, appropriate for gestational age.  Pain/Pressure: Absent     Pelvic: Cervical exam deferred        Extremities: Normal range of motion.  Edema: None  Mental Status:  Normal mood and affect. Normal behavior. Normal judgment and thought content.   Assessment and Plan:  Pregnancy: G2P1001 at [redacted]w[redacted]d  1. [redacted] weeks gestation of pregnancy   2. Encounter for supervision of low-risk pregnancy in third trimester BP borderline elevated, but has been all pregnancy, no s/sx of preeclampsia - [place] MFM OB FOLLOW UP; Future  3. Rh  negative state in antepartum period Rhogam after delivery  4. False positive RPR   5. Size of fetus inconsistent with dates, antepartum Fundal height 37.5 , will check growth and EFW - [place] [facility] OB FOLLOW UP; Future  Preterm labor symptoms and general obstetric precautions including but not limited to vaginal bleeding, contractions, leaking of fluid and fetal movement were reviewed in detail with the patient.  Please refer to After Visit Summary for other counseling recommendations.   Return in about 1 week (around 09/08/2020) for LOB, in person, 36 weeks swabs.   Mariel Aloe, MD Faculty Attending [facility]

## 2020-09-02 ENCOUNTER — Encounter: Payer: Medicaid Other | Admitting: Student

## 2020-09-08 ENCOUNTER — Encounter: Payer: Medicaid Other | Admitting: Obstetrics and Gynecology

## 2020-09-12 ENCOUNTER — Ambulatory Visit: Payer: Medicaid Other | Admitting: *Deleted

## 2020-09-12 ENCOUNTER — Encounter: Payer: Self-pay | Admitting: *Deleted

## 2020-09-12 ENCOUNTER — Ambulatory Visit: Payer: Medicaid Other | Attending: Obstetrics and Gynecology

## 2020-09-12 ENCOUNTER — Other Ambulatory Visit: Payer: Self-pay

## 2020-09-12 VITALS — BP 147/85 | HR 128

## 2020-09-12 DIAGNOSIS — Z3493 Encounter for supervision of normal pregnancy, unspecified, third trimester: Secondary | ICD-10-CM

## 2020-09-12 DIAGNOSIS — O26849 Uterine size-date discrepancy, unspecified trimester: Secondary | ICD-10-CM | POA: Diagnosis not present

## 2020-09-12 NOTE — Progress Notes (Unsigned)
Dr. Fang aware of elevated BP's. 

## 2020-09-16 ENCOUNTER — Other Ambulatory Visit: Payer: Self-pay

## 2020-09-16 ENCOUNTER — Telehealth (HOSPITAL_COMMUNITY): Payer: Self-pay | Admitting: *Deleted

## 2020-09-16 ENCOUNTER — Encounter (HOSPITAL_COMMUNITY): Payer: Self-pay

## 2020-09-16 ENCOUNTER — Ambulatory Visit (INDEPENDENT_AMBULATORY_CARE_PROVIDER_SITE_OTHER): Payer: Medicaid Other

## 2020-09-16 ENCOUNTER — Encounter: Payer: Self-pay | Admitting: Family Medicine

## 2020-09-16 ENCOUNTER — Ambulatory Visit (INDEPENDENT_AMBULATORY_CARE_PROVIDER_SITE_OTHER): Payer: Medicaid Other | Admitting: Family Medicine

## 2020-09-16 ENCOUNTER — Encounter (HOSPITAL_COMMUNITY): Payer: Self-pay | Admitting: *Deleted

## 2020-09-16 ENCOUNTER — Other Ambulatory Visit (HOSPITAL_COMMUNITY)
Admission: RE | Admit: 2020-09-16 | Discharge: 2020-09-16 | Disposition: A | Discharge status: Home / Self Care | Payer: Medicaid Other | Source: Ambulatory Visit | Attending: Family Medicine | Admitting: Family Medicine

## 2020-09-16 ENCOUNTER — Telehealth: Payer: Self-pay

## 2020-09-16 VITALS — BP 138/93 | HR 114 | Wt 217.2 lb

## 2020-09-16 DIAGNOSIS — O10919 Unspecified pre-existing hypertension complicating pregnancy, unspecified trimester: Secondary | ICD-10-CM

## 2020-09-16 DIAGNOSIS — O10913 Unspecified pre-existing hypertension complicating pregnancy, third trimester: Secondary | ICD-10-CM | POA: Diagnosis not present

## 2020-09-16 DIAGNOSIS — Z3A37 37 weeks gestation of pregnancy: Secondary | ICD-10-CM

## 2020-09-16 DIAGNOSIS — O26893 Other specified pregnancy related conditions, third trimester: Secondary | ICD-10-CM | POA: Insufficient documentation

## 2020-09-16 DIAGNOSIS — O26899 Other specified pregnancy related conditions, unspecified trimester: Secondary | ICD-10-CM

## 2020-09-16 DIAGNOSIS — Z6791 Unspecified blood type, Rh negative: Secondary | ICD-10-CM | POA: Diagnosis not present

## 2020-09-16 DIAGNOSIS — O99013 Anemia complicating pregnancy, third trimester: Secondary | ICD-10-CM | POA: Diagnosis present

## 2020-09-16 DIAGNOSIS — Z3493 Encounter for supervision of normal pregnancy, unspecified, third trimester: Secondary | ICD-10-CM

## 2020-09-16 DIAGNOSIS — Z9289 Personal history of other medical treatment: Secondary | ICD-10-CM

## 2020-09-16 LAB — COMPREHENSIVE METABOLIC PANEL
ALT: 12 IU/L (ref 0–32)
AST: 19 IU/L (ref 0–40)
Albumin/Globulin Ratio: 1.4 (ref 1.2–2.2)
Albumin: 3.7 g/dL — ABNORMAL LOW (ref 3.8–4.8)
Alkaline Phosphatase: 139 IU/L — ABNORMAL HIGH (ref 44–121)
BUN/Creatinine Ratio: 8 — ABNORMAL LOW (ref 9–23)
BUN: 5 mg/dL — ABNORMAL LOW (ref 6–20)
Bilirubin Total: <0.2 mg/dL (ref 0.0–1.2)
CO2: 19 mmol/L — ABNORMAL LOW (ref 20–29)
Calcium: 9.5 mg/dL (ref 8.7–10.2)
Chloride: 102 mmol/L (ref 96–106)
Creatinine, Ser: 0.6 mg/dL (ref 0.57–1.00)
Globulin, Total: 2.6 g/dL (ref 1.5–4.5)
Glucose: 100 mg/dL — ABNORMAL HIGH (ref 65–99)
Potassium: 4 mmol/L (ref 3.5–5.2)
Sodium: 138 mmol/L (ref 134–144)
Total Protein: 6.3 g/dL (ref 6.0–8.5)
eGFR: 123 mL/min/{1.73_m2} (ref >59)

## 2020-09-16 LAB — CBC
Hematocrit: 30.9 % — ABNORMAL LOW (ref 34.0–46.6)
Hemoglobin: 9.9 g/dL — ABNORMAL LOW (ref 11.1–15.9)
MCH: 23.7 pg — ABNORMAL LOW (ref 26.6–33.0)
MCHC: 32 g/dL (ref 31.5–35.7)
MCV: 74 fL — ABNORMAL LOW (ref 79–97)
Platelets: 254 10*3/uL (ref 150–450)
RBC: 4.18 x10E6/uL (ref 3.77–5.28)
RDW: 18.4 % — ABNORMAL HIGH (ref 11.7–15.4)
WBC: 4.8 10*3/uL (ref 3.4–10.8)

## 2020-09-16 MED ORDER — NIFEDIPINE ER OSMOTIC RELEASE 30 MG PO TB24: 30.0000 mg | ORAL_TABLET | Freq: Every day | ORAL | 2 refills | Status: DC

## 2020-09-16 NOTE — Progress Notes (Addendum)
   Subjective:  Melissa Howell is a 30 y.o. G2P1001 at [redacted]w[redacted]d being seen today for ongoing prenatal care.  She is currently monitored for the following issues for this high-risk pregnancy and has False positive RPR; Supervision of low-risk pregnancy; Rh negative state in antepartum period; Rash; Elevated BP without diagnosis of hypertension; Anemia during pregnancy in third trimester; [redacted] weeks gestation of pregnancy; and Size of fetus inconsistent with dates, antepartum on their problem list.  Patient reports  worsening swelling in her hands .  Contractions: Not present. Vag. Bleeding: None.  Movement: Present. Denies leaking of fluid.   Denies headache, vision changes, chest pain, SOB, RUQ pain.  The following portions of the patient's history were reviewed and updated as appropriate: allergies, current medications, past family history, past medical history, past social history, past surgical history and problem list. Problem list updated.  Objective:   Vitals:   09/16/20 1048  BP: (!) 138/93  Pulse: (!) 114  Weight: 217 lb 3.2 oz (98.5 kg)    Fetal Status: Fetal Heart Rate (bpm): 140   Movement: Present     General:  Alert, oriented and cooperative. Patient is in no acute distress.  Skin: Skin is warm and dry. No rash noted.   Cardiovascular: Normal heart rate noted  Respiratory: Normal respiratory effort, no problems with respiration noted  Abdomen: Soft, gravid, appropriate for gestational age. Pain/Pressure: Absent     Pelvic: Vag. Bleeding: None     Cervical exam deferred        Extremities: Normal range of motion.  Edema: None  Mental Status: Normal mood and affect. Normal behavior. Normal judgment and thought content.   Urinalysis:      Assessment and Plan:  Pregnancy: G2P1001 at [redacted]w[redacted]d  1. [redacted] weeks gestation of pregnancy - Culture, beta strep (group b only) - GC/Chlamydia probe amp ([place])not at [facility]  2. Encounter for supervision of high-risk pregnancy in third  trimester BP elevated, see below FHR normal Swabs today  3. Rh negative state in antepartum period Given rhogam in May  4. False positive RPR   5. Anemia during pregnancy in third trimester On PO iron  6. Chronic hypertension affecting pregnancy On review of chart patient has diagnosis of cHTN with several mild range BP's over the years BP has been progressively worse over the past few weeks and today reports worsening swelling, otherwise she is asymptomatic C/f developing pre-e discussed with patient Recommended we obtain labs today and start Nifedipine 30 XL Given worsening BP recommend IOL by 38 wks per MFM protocol, she is amenable and will be scheduled for 09/23/20 at [redacted]w[redacted]d at 0800 Orders placed Will also obtain BPP today If labs or BPP abnormal will send to L&D for delivery  Term labor symptoms and general obstetric precautions including but not limited to vaginal bleeding, contractions, leaking of fluid and fetal movement were reviewed in detail with the patient. Please refer to After Visit Summary for other counseling recommendations.  Return in 1 week (on 09/23/2020).   Venora Maples, MD

## 2020-09-16 NOTE — Addendum Note (Signed)
Addended by: Jill Side on: 09/16/2020 12:35 PM   Modules accepted: Orders

## 2020-09-16 NOTE — Addendum Note (Signed)
Addended by: Merian Capron on: 09/16/2020 11:57 AM   Modules accepted: Orders, SmartSet

## 2020-09-16 NOTE — Patient Instructions (Signed)

## 2020-09-16 NOTE — Telephone Encounter (Signed)
Patient called in wanting to know about induction information she has two different things on her mycahrt and is confused    Please call and advise

## 2020-09-16 NOTE — Telephone Encounter (Signed)
Preadmission screen  

## 2020-09-17 ENCOUNTER — Other Ambulatory Visit: Payer: Self-pay | Admitting: Advanced Practice Midwife

## 2020-09-17 ENCOUNTER — Encounter: Payer: Self-pay | Admitting: *Deleted

## 2020-09-17 LAB — GC/CHLAMYDIA PROBE AMP (~~LOC~~) NOT AT ARMC
Chlamydia: NEGATIVE
Comment: NEGATIVE
Comment: NORMAL
Neisseria Gonorrhea: NEGATIVE

## 2020-09-17 LAB — PROTEIN / CREATININE RATIO, URINE
Creatinine, Urine: 160.1 mg/dL
Protein, Ur: 23.7 mg/dL
Protein/Creat Ratio: 148 mg/g creat (ref 0–200)

## 2020-09-17 NOTE — Telephone Encounter (Signed)
Call returned to pt and message left stating that I am returning her call to answer questions about her induction. I stated that I will send a Mychart message.

## 2020-09-20 LAB — CULTURE, BETA STREP (GROUP B ONLY): Strep Gp B Culture: NEGATIVE

## 2020-09-22 ENCOUNTER — Other Ambulatory Visit: Payer: Self-pay | Admitting: Obstetrics and Gynecology

## 2020-09-23 ENCOUNTER — Other Ambulatory Visit: Payer: Self-pay | Admitting: Advanced Practice Midwife

## 2020-09-23 ENCOUNTER — Inpatient Hospital Stay (HOSPITAL_COMMUNITY): Payer: Medicaid Other

## 2020-09-23 LAB — SARS CORONAVIRUS 2 (TAT 6-24 HRS): SARS Coronavirus 2: NEGATIVE

## 2020-09-24 ENCOUNTER — Encounter (HOSPITAL_COMMUNITY): Payer: Self-pay | Admitting: Family Medicine

## 2020-09-24 ENCOUNTER — Inpatient Hospital Stay (HOSPITAL_COMMUNITY): Payer: Medicaid Other | Admitting: Anesthesiology

## 2020-09-24 ENCOUNTER — Other Ambulatory Visit: Payer: Self-pay

## 2020-09-24 ENCOUNTER — Inpatient Hospital Stay (HOSPITAL_COMMUNITY)
Admission: AD | Admit: 2020-09-24 | Discharge: 2020-09-26 | DRG: 807 | Disposition: A | Discharge status: Home / Self Care | Payer: Medicaid Other | Attending: Family Medicine | Admitting: Family Medicine

## 2020-09-24 DIAGNOSIS — O114 Pre-existing hypertension with pre-eclampsia, complicating childbirth: Principal | ICD-10-CM | POA: Diagnosis present

## 2020-09-24 DIAGNOSIS — Z6791 Unspecified blood type, Rh negative: Secondary | ICD-10-CM | POA: Diagnosis not present

## 2020-09-24 DIAGNOSIS — O1002 Pre-existing essential hypertension complicating childbirth: Secondary | ICD-10-CM | POA: Diagnosis present

## 2020-09-24 DIAGNOSIS — O26893 Other specified pregnancy related conditions, third trimester: Secondary | ICD-10-CM | POA: Diagnosis not present

## 2020-09-24 DIAGNOSIS — O1493 Unspecified pre-eclampsia, third trimester: Secondary | ICD-10-CM | POA: Clinically undetermined

## 2020-09-24 DIAGNOSIS — O26899 Other specified pregnancy related conditions, unspecified trimester: Secondary | ICD-10-CM

## 2020-09-24 DIAGNOSIS — Z3A38 38 weeks gestation of pregnancy: Secondary | ICD-10-CM | POA: Diagnosis not present

## 2020-09-24 DIAGNOSIS — D649 Anemia, unspecified: Secondary | ICD-10-CM | POA: Diagnosis not present

## 2020-09-24 DIAGNOSIS — O119 Pre-existing hypertension with pre-eclampsia, unspecified trimester: Secondary | ICD-10-CM | POA: Clinically undetermined

## 2020-09-24 DIAGNOSIS — O9902 Anemia complicating childbirth: Secondary | ICD-10-CM | POA: Diagnosis not present

## 2020-09-24 DIAGNOSIS — Z9289 Personal history of other medical treatment: Secondary | ICD-10-CM

## 2020-09-24 DIAGNOSIS — O1092 Unspecified pre-existing hypertension complicating childbirth: Secondary | ICD-10-CM | POA: Diagnosis not present

## 2020-09-24 DIAGNOSIS — O164 Unspecified maternal hypertension, complicating childbirth: Secondary | ICD-10-CM | POA: Diagnosis not present

## 2020-09-24 DIAGNOSIS — Z3493 Encounter for supervision of normal pregnancy, unspecified, third trimester: Secondary | ICD-10-CM

## 2020-09-24 LAB — COMPREHENSIVE METABOLIC PANEL
ALT: 13 U/L (ref 0–44)
AST: 21 U/L (ref 15–41)
Albumin: 2.9 g/dL — ABNORMAL LOW (ref 3.5–5.0)
Alkaline Phosphatase: 136 U/L — ABNORMAL HIGH (ref 38–126)
Anion gap: 11 (ref 5–15)
BUN: 8 mg/dL (ref 6–20)
CO2: 19 mmol/L — ABNORMAL LOW (ref 22–32)
Calcium: 9.2 mg/dL (ref 8.9–10.3)
Chloride: 104 mmol/L (ref 98–111)
Creatinine, Ser: 0.62 mg/dL (ref 0.44–1.00)
GFR, Estimated: >60 mL/min (ref >60)
Glucose, Bld: 83 mg/dL (ref 70–99)
Potassium: 3.6 mmol/L (ref 3.5–5.1)
Sodium: 134 mmol/L — ABNORMAL LOW (ref 135–145)
Total Bilirubin: 0.5 mg/dL (ref 0.3–1.2)
Total Protein: 6.5 g/dL (ref 6.5–8.1)

## 2020-09-24 LAB — PROTEIN / CREATININE RATIO, URINE
Creatinine, Urine: 80 mg/dL
Protein Creatinine Ratio: 0.46 mg/mg{Cre} — ABNORMAL HIGH (ref 0.00–0.15)
Total Protein, Urine: 37 mg/dL

## 2020-09-24 LAB — CBC
HCT: 33.5 % — ABNORMAL LOW (ref 36.0–46.0)
Hemoglobin: 10.4 g/dL — ABNORMAL LOW (ref 12.0–15.0)
MCH: 23.6 pg — ABNORMAL LOW (ref 26.0–34.0)
MCHC: 31 g/dL (ref 30.0–36.0)
MCV: 76.1 fL — ABNORMAL LOW (ref 80.0–100.0)
Platelets: 308 10*3/uL (ref 150–400)
RBC: 4.4 MIL/uL (ref 3.87–5.11)
RDW: 20.8 % — ABNORMAL HIGH (ref 11.5–15.5)
WBC: 5.6 10*3/uL (ref 4.0–10.5)
nRBC: 0 % (ref 0.0–0.2)

## 2020-09-24 LAB — TYPE AND SCREEN
ABO/RH(D): O NEG
Antibody Screen: NEGATIVE
Weak D: POSITIVE

## 2020-09-24 MED ORDER — OXYTOCIN BOLUS FROM INFUSION
333.0000 mL | Freq: Once | INTRAVENOUS | Status: AC
  Administered 2020-09-24: 333 mL via INTRAVENOUS

## 2020-09-24 MED ORDER — LIDOCAINE HCL (PF) 1 % IJ SOLN
INTRAMUSCULAR | Status: DC | PRN
  Administered 2020-09-24 (×2): 4 mL via EPIDURAL

## 2020-09-24 MED ORDER — SOD CITRATE-CITRIC ACID 500-334 MG/5ML PO SOLN: 30.0000 mL | ORAL | Status: DC | PRN

## 2020-09-24 MED ORDER — ACETAMINOPHEN 325 MG PO TABS: 650.0000 mg | ORAL_TABLET | ORAL | Status: DC | PRN

## 2020-09-24 MED ORDER — PHENYLEPHRINE 40 MCG/ML (10ML) SYRINGE FOR IV PUSH (FOR BLOOD PRESSURE SUPPORT): 80.0000 ug | PREFILLED_SYRINGE | INTRAVENOUS | Status: DC | PRN

## 2020-09-24 MED ORDER — MISOPROSTOL 25 MCG QUARTER TABLET
25.0000 ug | ORAL_TABLET | ORAL | Status: DC | PRN
Start: 2020-09-24 — End: 2020-09-25
  Administered 2020-09-24: 25 ug via VAGINAL
  Filled 2020-09-24: qty 1

## 2020-09-24 MED ORDER — FENTANYL CITRATE (PF) 100 MCG/2ML IJ SOLN: 50.0000 ug | INTRAMUSCULAR | Status: DC | PRN

## 2020-09-24 MED ORDER — DIPHENHYDRAMINE HCL 50 MG/ML IJ SOLN: 12.5000 mg | INTRAMUSCULAR | Status: DC | PRN

## 2020-09-24 MED ORDER — TERBUTALINE SULFATE 1 MG/ML IJ SOLN: 0.2500 mg | Freq: Once | INTRAMUSCULAR | Status: DC | PRN

## 2020-09-24 MED ORDER — EPHEDRINE 5 MG/ML INJ: 10.0000 mg | INTRAVENOUS | Status: DC | PRN

## 2020-09-24 MED ORDER — LIDOCAINE HCL (PF) 1 % IJ SOLN: 30.0000 mL | INTRAMUSCULAR | Status: DC | PRN

## 2020-09-24 MED ORDER — LACTATED RINGERS IV SOLN
500.0000 mL | Freq: Once | INTRAVENOUS | Status: AC
  Administered 2020-09-24: 500 mL via INTRAVENOUS

## 2020-09-24 MED ORDER — OXYTOCIN-SODIUM CHLORIDE 30-0.9 UT/500ML-% IV SOLN
1.0000 m[IU]/min | INTRAVENOUS | Status: DC
  Filled 2020-09-24: qty 500

## 2020-09-24 MED ORDER — LACTATED RINGERS IV SOLN: 500.0000 mL | INTRAVENOUS | Status: DC | PRN

## 2020-09-24 MED ORDER — NIFEDIPINE ER OSMOTIC RELEASE 30 MG PO TB24
30.0000 mg | ORAL_TABLET | Freq: Every day | ORAL | Status: DC
  Administered 2020-09-24: 30 mg via ORAL
  Filled 2020-09-24: qty 1

## 2020-09-24 MED ORDER — LACTATED RINGERS IV SOLN
INTRAVENOUS | Status: DC
Start: 2020-09-24 — End: 2020-09-25

## 2020-09-24 MED ORDER — OXYTOCIN-SODIUM CHLORIDE 30-0.9 UT/500ML-% IV SOLN
2.5000 [IU]/h | INTRAVENOUS | Status: DC
  Administered 2020-09-24: 2.5 [IU]/h via INTRAVENOUS

## 2020-09-24 MED ORDER — ONDANSETRON HCL 4 MG/2ML IJ SOLN
4.0000 mg | Freq: Four times a day (QID) | INTRAMUSCULAR | Status: DC | PRN
  Administered 2020-09-24: 4 mg via INTRAVENOUS
  Filled 2020-09-24: qty 2

## 2020-09-24 MED ORDER — FENTANYL-BUPIVACAINE-NACL 0.5-0.125-0.9 MG/250ML-% EP SOLN
12.0000 mL/h | EPIDURAL | Status: DC | PRN
  Administered 2020-09-24: 12 mL/h via EPIDURAL
  Filled 2020-09-24: qty 250

## 2020-09-24 NOTE — Discharge Summary (Signed)
Postpartum Discharge Summary       Patient Name: Melissa Howell DOB: 24-Sep-1990 MRN: [id number]  Date of admission: 09/24/2020 Delivery date:09/24/2020  Delivering provider: Erskine Emery  Date of discharge: 09/26/2020  Admitting diagnosis: Chronic hypertension affecting pregnancy [O10.919] Intrauterine pregnancy: [redacted]w[redacted]d     Secondary diagnosis:  Active Problems:   False positive RPR   Rh negative state in antepartum period   Chronic hypertension with superimposed pre-eclampsia  Additional problems: none    Discharge diagnosis: Term Pregnancy Delivered and CHTN with superimposed preeclampsia                                              Post partum procedures:rhogam Augmentation: AROM, Cytotec, and IP Foley Complications: None  Hospital course: Induction of Labor With Vaginal Delivery   30 y.o. yo G2P1001 at [redacted]w[redacted]d was admitted to the hospital 09/24/2020 for induction of labor.  Indication for induction:  initially she was induced for cHTN, however her urine P/C ratio resulted at 0.46 and she was given a dx of SIPE (mild) .  Patient had an uncomplicated labor course, having the usual cervical ripening methods and progressing to vag del the same evening. Her Procardia was continued during labor as well as during her postpartum course. Membrane Rupture Time/Date: 5:27 PM ,09/24/2020   Delivery Method:Vaginal, Spontaneous  Episiotomy: None  Lacerations:  None  Details of delivery can be found in separate delivery note.  Patient had a postpartum course remarkable for BPs remaining 126-140/82/94 with Procardia. Patient is discharged home 09/26/20.  Newborn Data: Birth date:09/24/2020  Birth time:10:36 PM  Gender:Female  Living status:Living  Apgars:9 ,9  Weight:3.521 kg (7lb 12.2oz)  Magnesium Sulfate received: No BMZ received: No Rhophylac:Yes MMR:N/A T-DaP:Given prenatally Flu: No Transfusion:No  Physical exam  Vitals:   09/25/20 0824 09/25/20 1240 09/25/20 2040 09/26/20 0546   BP: (!) 148/92 140/82 140/86 (!) 126/94  Pulse: 96 92 (!) 106 89  Resp: $Remo'18 18 18 16  'GQYpg$ Temp: 98.4 F (36.9 C) 98.2 F (36.8 C) 98 F (36.7 C) 97.9 F (36.6 C)  TempSrc: Oral Oral Oral Oral  SpO2: 100% 100% 98% 100%  Weight:      Height:       General: alert, cooperative, and no distress Lochia: appropriate Uterine Fundus: firm Incision: N/A DVT Evaluation: No evidence of DVT seen on physical exam. Negative Homan's sign. No cords or calf tenderness. No significant calf/ankle edema. Labs: Lab Results  Component Value Date   WBC 5.6 09/24/2020   HGB 10.4 (L) 09/24/2020   HCT 33.5 (L) 09/24/2020   MCV 76.1 (L) 09/24/2020   PLT 308 09/24/2020   CMP Latest Ref Rng & Units 09/24/2020  Glucose 70 - 99 mg/dL 83  BUN 6 - 20 mg/dL 8  Creatinine 0.44 - 1.00 mg/dL 0.62  Sodium 135 - 145 mmol/L 134(L)  Potassium 3.5 - 5.1 mmol/L 3.6  Chloride 98 - 111 mmol/L 104  CO2 22 - 32 mmol/L 19(L)  Calcium 8.9 - 10.3 mg/dL 9.2  Total Protein 6.5 - 8.1 g/dL 6.5  Total Bilirubin 0.3 - 1.2 mg/dL 0.5  Alkaline Phos 38 - 126 U/L 136(H)  AST 15 - 41 U/L 21  ALT 0 - 44 U/L 13   Edinburgh Score: Edinburgh Postnatal Depression Scale Screening Tool 09/26/2020  I have been able to laugh and see the  funny side of things. 0  I have looked forward with enjoyment to things. 0  I have blamed myself unnecessarily when things went wrong. 0  I have been anxious or worried for no good reason. 0  I have felt scared or panicky for no good reason. 0  Things have been getting on top of me. 0  I have been so unhappy that I have had difficulty sleeping. 0  I have felt sad or miserable. 0  I have been so unhappy that I have been crying. 0  The thought of harming myself has occurred to me. 0  Edinburgh Postnatal Depression Scale Total 0     After visit meds:  Allergies as of 09/26/2020   No Known Allergies      Medication List     STOP taking these medications    cetirizine 10 MG tablet Commonly known  as: ZYRTEC   ferrous gluconate 324 MG tablet Commonly known as: FERGON   Vitafol Gummies 3.33-0.333-34.8 MG Chew       TAKE these medications    ibuprofen 600 MG tablet Commonly known as: ADVIL Take 1 tablet (600 mg total) by mouth every 6 (six) hours.   NIFEdipine 60 MG 24 hr tablet Commonly known as: ADALAT CC Take 1 tablet (60 mg total) by mouth daily. What changed:  medication strength how much to take         Discharge home in stable condition Infant Feeding: Breast Infant Disposition:home with mother Discharge instruction: per After Visit Summary and Postpartum booklet. Activity: Advance as tolerated. Pelvic rest for 6 weeks.  Diet: routine diet Future Appointments:No future appointments. Follow up Visit:  [place] [facility]. Schedule an appointment as soon as possible for a visit.   Specialty: Obstetrics and Gynecology Why: for a blood pressure check Contact information: [address] [place] [address] [phone]                Please schedule this patient for Postpartum visit in: 4 weeks with the following provider: Any provider  In-Person  For C/S patients schedule nurse incision check in weeks 2 weeks: no  High risk pregnancy complicated by: cHTN with pre-e  Delivery mode:  SVD  Anticipated Birth Control:  IUD  PP Procedures needed: BP check in 1wk  Schedule Integrated [place] visit: no   09/26/2020 Christin Fudge, CNM

## 2020-09-24 NOTE — Anesthesia Procedure Notes (Addendum)
Epidural Patient location during procedure: OB Start time: 09/24/2020 4:15 PM End time: 09/24/2020 4:27 PM  Staffing Anesthesiologist: Lewie Loron, MD Performed: anesthesiologist   Preanesthetic Checklist Completed: patient identified, IV checked, risks and benefits discussed, monitors and equipment checked, pre-op evaluation and timeout performed  Epidural Patient position: sitting Prep: DuraPrep and site prepped and draped Patient monitoring: heart rate, continuous pulse ox and blood pressure Approach: midline Location: L2-L3 Injection technique: LOR air and LOR saline  Needle:  Needle type: Tuohy  Needle gauge: 17 G Needle length: 9 cm Needle insertion depth: 6 cm Catheter type: closed end flexible Catheter size: 19 Gauge Catheter at skin depth: 11 cm Test dose: negative  Assessment Sensory level: T8 Events: blood not aspirated, injection not painful, no injection resistance, no paresthesia and negative IV test  Additional Notes Reason for block:procedure for pain

## 2020-09-24 NOTE — Progress Notes (Signed)
Melissa Howell is a 30 y.o. G2P1001 at [redacted]w[redacted]d admitted for induction of labor due to [facility] with SIPE w/o SF.  Subjective: Pt is doing well, in high fowlers position.   Objective: BP (!) 145/93   Pulse (!) 109   Temp 98.1 F (36.7 C)   Resp 16   Ht 5\' 5"  (1.651 m)   Wt 98.3 kg   LMP 12/31/2019 (Within Days)   SpO2 98%   BMI 36.08 kg/m  No intake/output data recorded. No intake/output data recorded.  FHT:  FHR: 130s-140s bpm, variability: moderate,  accelerations:  Present,  decelerations: Few UC:   regular, but not tracing well on toco SVE:   Dilation: 10 Effacement (%): 100 Station: Plus 1 Exam by:: Alexis [email or web address] RN  Labs: Lab Results  Component Value Date   WBC 5.6 09/24/2020   HGB 10.4 (L) 09/24/2020   HCT 33.5 (L) 09/24/2020   MCV 76.1 (L) 09/24/2020   PLT 308 09/24/2020    Assessment / Plan: Induction of labor due to preeclampsia,  progressing well.  Labor: Pt is complete, will labor down in high fowler's. Doing well. Anticipate SVD.  Preeclampsia:   BP's   elevated at 130s-150s/80s-90s, most recent 137/91. Monitor closely, continues to be asymptomatic.  Fetal Wellbeing:  Category 2>continue to monitor, overall reassuring.   Pain Control:  Epidural I/D:   GBS neg Anticipated MOD:  NSVD  Melissa Howell 09/24/2020, 9:33 PM

## 2020-09-24 NOTE — Progress Notes (Signed)
Disccused with RN Lynita Lombard. FB out, patient now with much improved SVE to 5/70/-2 and bulging bag. Patient very uncomfortable with ctx that are now regular. Cat I tracing. Patient wants to get epidural and then AROM + pit. Will recheck after patient is comfortable with epidural.  BP remains elevated, most recent 146/91, patient still asymptomatic.  Shirlean Mylar, MD [facility], PGY-3

## 2020-09-24 NOTE — Anesthesia Preprocedure Evaluation (Signed)
Anesthesia Evaluation  Patient identified by MRN, date of birth, ID band Patient awake    Reviewed: Allergy & Precautions, H&P , Patient's Chart, lab work & pertinent test results  Airway Mallampati: II  TM Distance: >3 FB Neck ROM: full    Dental  (+) Teeth Intact   Pulmonary former smoker,    breath sounds clear to auscultation       Cardiovascular hypertension, Pt. on medications  Rhythm:regular Rate:Normal     Neuro/Psych    GI/Hepatic   Endo/Other    Renal/GU      Musculoskeletal   Abdominal   Peds  Hematology  (+) anemia ,   Anesthesia Other Findings       Reproductive/Obstetrics (+) Pregnancy                             Anesthesia Physical  Anesthesia Plan  ASA: 2  Anesthesia Plan: Epidural   Post-op Pain Management:    Induction:   PONV Risk Score and Plan:   Airway Management Planned:   Additional Equipment:   Intra-op Plan:   Post-operative Plan:   Informed Consent: I have reviewed the patients History and Physical, chart, labs and discussed the procedure including the risks, benefits and alternatives for the proposed anesthesia with the patient or authorized representative who has indicated his/her understanding and acceptance.       Plan Discussed with:   Anesthesia Plan Comments:         Anesthesia Quick Evaluation

## 2020-09-24 NOTE — Progress Notes (Signed)
Melissa Howell is a 30 y.o. G2P1001 at [redacted]w[redacted]d admitted for induction of labor due to [facility] with SIPE w/o SF.  Subjective: Patient very comfortable after epidural placement, cannot feel ctx.  Objective: BP (!) 137/91   Pulse (!) 115   Temp 98.1 F (36.7 C)   Resp 16   Ht 5\' 5"  (1.651 m)   Wt 98.3 kg   LMP 12/31/2019 (Within Days)   SpO2 98%   BMI 36.08 kg/m  No intake/output data recorded. No intake/output data recorded.  FHT:  FHR: 130 bpm, variability: moderate,  accelerations:  Present,  decelerations:  Absent UC:   regular, but not tracing well on toco SVE:   Dilation: Lip/rim Effacement (%): 100 Station: Plus 1 Exam by:: Dr. [email or web address]  Labs: Lab Results  Component Value Date   WBC 5.6 09/24/2020   HGB 10.4 (L) 09/24/2020   HCT 33.5 (L) 09/24/2020   MCV 76.1 (L) 09/24/2020   PLT 308 09/24/2020    Assessment / Plan: Induction of labor due to preeclampsia,  progressing well.  Labor: Progressing well, no need to add pitocin now as patient has made good progress without it, can add if ctx space out. Preeclampsia:   BP's   elevated at 130s-150s/80s-90s, most recent 137/91. Monitor closely, asx. Fetal Wellbeing:  Category I Pain Control:  Epidural I/D:   GBS neg Anticipated MOD:  NSVD  11/24/2020 09/24/2020, 7:50 PM

## 2020-09-24 NOTE — Progress Notes (Signed)
Patient ID: Melissa Howell, female   DOB: [birth year], 30 y.o.   MRN: [id number]  Sitting straight up in bed; feeling mild pressure  BP 145/93, 139/91 FHR 130-140s, +accels, occ mi variables Ctx q 2-3 mins, spont Cx was C/C/vtx +1 per RN at 2123  IUP@38 .2wks cHTN- Procardia- no severe range BPs End 1st stage  Await urge to push over next 1-2hrs Anticipate vag del  [name] [facility] 09/24/2020 9:53 PM

## 2020-09-24 NOTE — H&P (Addendum)
OBSTETRIC ADMISSION HISTORY AND PHYSICAL  Melissa Howell is a 30 y.o. female G2P1001 with IUP at [redacted]w[redacted]d by LMP presenting for IOL due to [facility] with worsening control. She reports +FMs, No LOF, no VB, no blurry vision, headaches or peripheral edema, and RUQ pain.  She plans on breast feeding. She is undecided on birth control. She received her prenatal care at [facility]  Dating: By LMP --->  Estimated Date of Delivery: 10/06/20  Sono:    @[redacted]w[redacted]d , CWD, normal anatomy, cephalic presentation, anterior placental lie, 327g, 72% EFW   Prenatal History/Complications:  cHTN, recently started on procardia 30mg  daily  Third trimester size and date discrepancy, however MFM U/S on 7/22 with appropriate growth and AFI  False positive RPR (T.pallidum non-reactive)  Rh negative Anemia (hgb 9.9) on po iron   Past Medical History: Past Medical History:  Diagnosis Date   Hives    unsure why   Pregnancy induced hypertension     Past Surgical History: History reviewed. No pertinent surgical history.  Obstetrical History: OB History     Gravida  2   Para  1   Term  1   Preterm  0   AB  0   Living  1      SAB  0   IAB  0   Ectopic  0   Multiple  0   Live Births  1           Social History Social History   Socioeconomic History   Marital status: Single    Spouse name: Not on file   Number of children: Not on file   Years of education: Not on file   Highest education level: Not on file  Occupational History   Not on file  Tobacco Use   Smoking status: Never   Smokeless tobacco: Never  Vaping Use   Vaping Use: Never used  Substance and Sexual Activity   Alcohol use: Not Currently    Comment: OCC   Drug use: No   Sexual activity: Yes    Birth control/protection: None  Other Topics Concern   Not on file  Social History Narrative   Not on file   Social Determinants of Health   Financial Resource Strain: Not on file  Food Insecurity: No Food Insecurity   Worried  About Running Out of Food in the Last Year: Never true   Ran Out of Food in the Last Year: Never true  Transportation Needs: No Transportation Needs   Lack of Transportation (Medical): No   Lack of Transportation (Non-Medical): No  Physical Activity: Not on file  Stress: Not on file  Social Connections: Not on file    Family History: Family History  Problem Relation Age of Onset   Healthy Mother    Hypertension Mother    Diabetes Paternal Grandfather    Hypertension Paternal Grandfather     Allergies: No Known Allergies  Medications Prior to Admission  Medication Sig Dispense Refill Last Dose   cetirizine (ZYRTEC) 10 MG tablet Take 10 mg by mouth daily as needed for allergies.   09/24/2020   ferrous gluconate (FERGON) 324 MG tablet Take 1 tablet (324 mg total) by mouth every other day. 30 tablet 3 Past Week   NIFEdipine (PROCARDIA-XL/NIFEDICAL-XL) 30 MG 24 hr tablet Take 1 tablet (30 mg total) by mouth daily. 30 tablet 2 09/23/2020   Prenatal Vit-Fe Phos-FA-Omega (VITAFOL GUMMIES) 3.33-0.333-34.8 MG CHEW Chew 3 each by mouth daily. 90 tablet 11 09/24/2020  Review of Systems   All systems reviewed and negative except as stated in HPI  Blood pressure (!) 138/95, pulse (!) 107, temperature 98.3 F (36.8 C), temperature source Oral, resp. rate 18, height 5\' 5"  (1.651 m), weight 98.3 kg, last menstrual period 12/31/2019. General appearance: alert, cooperative, appears stated age, and no distress Lungs: clear to auscultation bilaterally Heart: regular rate and rhythm Abdomen: soft, non-tender; bowel sounds normal Pelvic: Normal external female genitalia; CE 1.5/50/-3 Extremities: Homans sign is negative, no sign of DVT DTR's 2+ Presentation: cephalic Fetal monitoringBaseline: 135 bpm, Variability: Good {> 6 bpm), Accelerations: Reactive, and Decelerations: Absent Uterine activity irreg, every 10-15 m Dilation: 1.5 Effacement (%): 50 Station: -3 Exam by:: A Showfety  RN   Prenatal labs: ABO, Rh: --/--/PENDING (08/03 1015) Antibody: NEG (08/03 1015) Rubella: 1.31 (03/14 1627) RPR: Reactive (05/27 0832)  HBsAg: Negative (03/14 1627)  HIV: Non Reactive (05/27 0832)  GBS: Negative/-- (07/26 1130)  1 hr Glucola WNL Genetic screening  LR NIPS Anatomy 12-11-1978 WNL  Prenatal Transfer Tool  Maternal Diabetes: No Genetic Screening: Normal Maternal Ultrasounds/Referrals: Other: size/date discrepancy, WNL Fetal Ultrasounds or other Referrals:  None Maternal Substance Abuse:  No Significant Maternal Medications:  Meds include: Other:  procardia Significant Maternal Lab Results: Group B Strep negative and Rh negative  Results for orders placed or performed during the hospital encounter of 09/24/20 (from the past 24 hour(s))  CBC   Collection Time: 09/24/20 10:15 AM  Result Value Ref Range   WBC 5.6 4.0 - 10.5 K/uL   RBC 4.40 3.87 - 5.11 MIL/uL   Hemoglobin 10.4 (L) 12.0 - 15.0 g/dL   HCT 11/24/20 (L) 09.7 - 35.3 %   MCV 76.1 (L) 80.0 - 100.0 fL   MCH 23.6 (L) 26.0 - 34.0 pg   MCHC 31.0 30.0 - 36.0 g/dL   RDW 29.9 (H) 24.2 - 68.3 %   Platelets 308 150 - 400 K/uL   nRBC 0.0 0.0 - 0.2 %  Comprehensive metabolic panel   Collection Time: 09/24/20 10:15 AM  Result Value Ref Range   Sodium 134 (L) 135 - 145 mmol/L   Potassium 3.6 3.5 - 5.1 mmol/L   Chloride 104 98 - 111 mmol/L   CO2 19 (L) 22 - 32 mmol/L   Glucose, Bld 83 70 - 99 mg/dL   BUN 8 6 - 20 mg/dL   Creatinine, Ser 11/24/20 0.44 - 1.00 mg/dL   Calcium 9.2 8.9 - 6.22 mg/dL   Total Protein 6.5 6.5 - 8.1 g/dL   Albumin 2.9 (L) 3.5 - 5.0 g/dL   AST 21 15 - 41 U/L   ALT 13 0 - 44 U/L   Alkaline Phosphatase 136 (H) 38 - 126 U/L   Total Bilirubin 0.5 0.3 - 1.2 mg/dL   GFR, Estimated 29.7 >98 mL/min   Anion gap 11 5 - 15  Type and screen   Collection Time: 09/24/20 10:15 AM  Result Value Ref Range   ABO/RH(D) PENDING    Antibody Screen NEG    Sample Expiration      09/27/2020,2359 Performed at [facility], 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 G2P1001 at [redacted]w[redacted]d here for IOL due to [place].  #Labor: Started with cytotec x1, FB placed. Will reassess and advance to pitocin as able.  #cHTN: Worsening pressures over the past several weeks, recently started on procardia. Pre-e labs were negative on 7/26, PCR pending today. She is asymptomatic. Today BP's remain elevated at 152/104, 138/95. Will continue to monitor BP closely. CMP WNL, CBC with mild anemia. -procardia 30 mg qd ordered  #Size date discrepancy: During third trimester patient had fundal heights measuring greater than dates. Anatomy US at 19w consistent with dates. Then f/u US on 09/12/20 at [redacted]w[redacted]d showed size of [redacted]w[redacted]d, normal AFI. Patient has pelvis proven to 8#4oz with G1, NSVD, no h/o shoulder dystocia. EFW on 7/22 83rd percentile 7#4oz, hadlock calculation estimates fetus to be 3306 g today. MFM recommended no further work up at last [place] on 7/22.  #Rh negative: O- blood type, given Rhogam in May. Will assess for rhogam administration after delivery.   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  #Anemia: on oral iron supplementation during pregnancy, hgb at admission 10.4.  #Pain: prn #FWB: Cat I #ID:  GBS neg #MOF: breast #MOC: post partum IUD #Circ:  yes  Shirlean Mylar, MD  09/24/2020, 12:16 PM  GME ATTESTATION:  I saw and evaluated the patient. I agree with the findings and the plan of  care as documented in the resident's note. My edits were made to the above as needed.   Leticia Penna, DO OB Fellow, Faculty [facility], Center for [facility] 09/24/2020 2:08 PM

## 2020-09-24 NOTE — Progress Notes (Signed)
Melissa Howell is a 30 y.o. G2P1001 at [redacted]w[redacted]d admitted for induction of labor due to [facility] with SIPE w/o SF.  Subjective: Patient very comfortable after epidural placement.  Objective: BP (!) 137/91   Pulse 96   Temp 98.5 F (36.9 C) (Oral)   Resp 17   Ht 5\' 5"  (1.651 m)   Wt 98.3 kg   LMP 12/31/2019 (Within Days)   SpO2 98%   BMI 36.08 kg/m  No intake/output data recorded. No intake/output data recorded.  FHT:  FHR: 130 bpm, variability: moderate,  accelerations:  Present,  decelerations:  Absent UC:   regular, every 2 minutes SVE:   Dilation: 6.5 Effacement (%): 90 Station: -1 Exam by:: Lorijean Husser MD  Labs: Lab Results  Component Value Date   WBC 5.6 09/24/2020   HGB 10.4 (L) 09/24/2020   HCT 33.5 (L) 09/24/2020   MCV 76.1 (L) 09/24/2020   PLT 308 09/24/2020    Assessment / Plan: Induction of labor due to preeclampsia,  progressing well.  Labor:  AROM for clear at 1730. Patient progressing very well, can add pitocin if ctx slow down or no change. Preeclampsia:   BP's   elevated at 130s-150s/80s-90s, most recent 137/91. Monitor closely, asx. Fetal Wellbeing:  Category I Pain Control:  Epidural I/D:   GBS neg Anticipated MOD:  NSVD  11/24/2020 09/24/2020, 5:39 PM

## 2020-09-25 ENCOUNTER — Encounter (HOSPITAL_COMMUNITY): Payer: Self-pay | Admitting: Family Medicine

## 2020-09-25 LAB — RPR
RPR Ser Ql: REACTIVE — AB
RPR Titer: 1:1 {titer}

## 2020-09-25 LAB — KLEIHAUER-BETKE STAIN
# Vials RhIg: 1
Fetal Cells %: 0 %
Quantitation Fetal Hemoglobin: 0 mL

## 2020-09-25 MED ORDER — PRENATAL MULTIVITAMIN CH
1.0000 | ORAL_TABLET | Freq: Every day | ORAL | Status: DC
  Administered 2020-09-25 – 2020-09-26 (×2): 1 via ORAL
  Filled 2020-09-25 (×2): qty 1

## 2020-09-25 MED ORDER — ONDANSETRON HCL 4 MG PO TABS: 4.0000 mg | ORAL_TABLET | ORAL | Status: DC | PRN

## 2020-09-25 MED ORDER — WITCH HAZEL-GLYCERIN EX PADS: 1.0000 "application " | MEDICATED_PAD | CUTANEOUS | Status: DC | PRN

## 2020-09-25 MED ORDER — SIMETHICONE 80 MG PO CHEW: 80.0000 mg | CHEWABLE_TABLET | ORAL | Status: DC | PRN

## 2020-09-25 MED ORDER — TETANUS-DIPHTH-ACELL PERTUSSIS 5-2.5-18.5 LF-MCG/0.5 IM SUSY: 0.5000 mL | PREFILLED_SYRINGE | Freq: Once | INTRAMUSCULAR | Status: DC

## 2020-09-25 MED ORDER — RHO D IMMUNE GLOBULIN 1500 UNIT/2ML IJ SOSY
300.0000 ug | PREFILLED_SYRINGE | Freq: Once | INTRAMUSCULAR | Status: AC
  Administered 2020-09-25: 300 ug via INTRAVENOUS
  Filled 2020-09-25: qty 2

## 2020-09-25 MED ORDER — NIFEDIPINE ER OSMOTIC RELEASE 30 MG PO TB24
60.0000 mg | ORAL_TABLET | Freq: Every day | ORAL | Status: DC
  Administered 2020-09-25 – 2020-09-26 (×2): 60 mg via ORAL
  Filled 2020-09-25 (×3): qty 2

## 2020-09-25 MED ORDER — ACETAMINOPHEN 325 MG PO TABS
650.0000 mg | ORAL_TABLET | ORAL | Status: DC | PRN
  Administered 2020-09-26: 650 mg via ORAL
  Filled 2020-09-25: qty 2

## 2020-09-25 MED ORDER — SENNOSIDES-DOCUSATE SODIUM 8.6-50 MG PO TABS
2.0000 | ORAL_TABLET | Freq: Every day | ORAL | Status: DC
  Administered 2020-09-25 – 2020-09-26 (×2): 2 via ORAL
  Filled 2020-09-25 (×2): qty 2

## 2020-09-25 MED ORDER — ONDANSETRON HCL 4 MG/2ML IJ SOLN: 4.0000 mg | INTRAMUSCULAR | Status: DC | PRN

## 2020-09-25 MED ORDER — DIBUCAINE (PERIANAL) 1 % EX OINT: 1.0000 "application " | TOPICAL_OINTMENT | CUTANEOUS | Status: DC | PRN

## 2020-09-25 MED ORDER — IBUPROFEN 600 MG PO TABS
600.0000 mg | ORAL_TABLET | Freq: Four times a day (QID) | ORAL | Status: DC
  Administered 2020-09-25 – 2020-09-26 (×6): 600 mg via ORAL
  Filled 2020-09-25 (×6): qty 1

## 2020-09-25 MED ORDER — DIPHENHYDRAMINE HCL 25 MG PO CAPS: 25.0000 mg | ORAL_CAPSULE | Freq: Four times a day (QID) | ORAL | Status: DC | PRN

## 2020-09-25 MED ORDER — COCONUT OIL OIL: 1.0000 "application " | TOPICAL_OIL | Status: DC | PRN

## 2020-09-25 MED ORDER — BENZOCAINE-MENTHOL 20-0.5 % EX AERO: 1.0000 "application " | INHALATION_SPRAY | CUTANEOUS | Status: DC | PRN

## 2020-09-25 NOTE — Progress Notes (Signed)
Post Partum Day 1 Subjective: no complaints, up ad lib, voiding, tolerating PO, and + flatus. Decreased bleeding, no clots. Pain 0/10. Received Procardia 60mg  at 0333.   Objective: Blood pressure (!) 148/92, pulse 96, temperature 98.4 F (36.9 C), temperature source Oral, resp. rate 18, height 5\' 5"  (1.651 m), weight 98.3 kg, last menstrual period 12/31/2019, SpO2 100 %, unknown if currently breastfeeding.  Physical Exam:  General: alert, cooperative, and no distress Lochia: appropriate Uterine Fundus: firm DVT Evaluation: No evidence of DVT seen on physical exam. No significant calf/ankle edema.  Recent Labs    09/24/20 1015  HGB 10.4*  HCT 33.5*    Assessment/Plan: Circumcision prior to discharge  SIPE On Procardia 60mg . Will continue to monitor BP. If not improved, consider increasing Procardia dose.   LOS: 1 day   13/09/2019, Medical Student 09/25/2020, 8:44 AM

## 2020-09-25 NOTE — Anesthesia Postprocedure Evaluation (Signed)
Anesthesia Post Note  Patient: Isabele S Gaster  Procedure(s) Performed: AN AD HOC LABOR EPIDURAL     Patient location during evaluation: Mother Baby Anesthesia Type: Epidural Level of consciousness: awake and alert Pain management: pain level controlled Vital Signs Assessment: post-procedure vital signs reviewed and stable Respiratory status: spontaneous breathing, nonlabored ventilation and respiratory function stable Cardiovascular status: stable Postop Assessment: no headache, no backache and epidural receding Anesthetic complications: no   No notable events documented.  Last Vitals:  Vitals:   09/25/20 0555 09/25/20 0824  BP: 131/80 (!) 148/92  Pulse: 98 96  Resp: 18 18  Temp: 37 C 36.9 C  SpO2: 100% 100%    Last Pain:  Vitals:   09/25/20 0825  TempSrc:   PainSc: 4    Pain Goal:                Epidural/Spinal Function Cutaneous sensation: Normal sensation (09/25/20 0825), Patient able to flex knees: Yes (09/25/20 0825), Patient able to lift hips off bed: Yes (09/25/20 0825), Back pain beyond tenderness at insertion site: No (09/25/20 0825), Progressively worsening motor and/or sensory loss: No (09/25/20 0825), Bowel and/or bladder incontinence post epidural: No (09/25/20 0825)  Junious Silk

## 2020-09-26 ENCOUNTER — Encounter: Payer: Medicaid Other | Admitting: Obstetrics and Gynecology

## 2020-09-26 ENCOUNTER — Other Ambulatory Visit (HOSPITAL_COMMUNITY): Payer: Self-pay

## 2020-09-26 LAB — RH IG WORKUP (INCLUDES ABO/RH)
Gestational Age(Wks): 38.2
Unit division: 0

## 2020-09-26 MED ORDER — NIFEDIPINE ER 60 MG PO TB24
60.0000 mg | ORAL_TABLET | Freq: Every day | ORAL | 0 refills | Status: DC
  Filled 2020-09-26: qty 30, 30d supply, fill #0

## 2020-09-26 MED ORDER — IBUPROFEN 600 MG PO TABS
600.0000 mg | ORAL_TABLET | Freq: Four times a day (QID) | ORAL | 0 refills | Status: DC
  Filled 2020-09-26: qty 30, 8d supply, fill #0

## 2020-09-27 ENCOUNTER — Encounter: Payer: Self-pay | Admitting: Pediatrics

## 2020-09-27 NOTE — Progress Notes (Signed)
Pediatric provider following up patient's newborn son Melissa Howell in clinic today.  Accessed mother's chart with her verbal consent to follow-up on her TPPA labs (RPR reactive) to help guide infant's management.  TPPA still pending.   Enis Gash, MD [facility]

## 2020-09-29 ENCOUNTER — Telehealth: Payer: Self-pay

## 2020-09-29 NOTE — Telephone Encounter (Signed)
Transition Care Management Follow-up Telephone Call Date of discharge and from where: 09/26/2020 from [facility] How have you been since you were released from the hospital? Pt stated that she is feeling well and does not have any questions or concerns at this time.  Any questions or concerns? No  Items Reviewed: Did the pt receive and understand the discharge instructions provided? Yes  Medications obtained and verified? Yes  Other? No  Any new allergies since your discharge? No  Dietary orders reviewed? No Do you have support at home? Yes   Functional Questionnaire: (I = Independent and D = Dependent) ADLs: I  Bathing/Dressing- I  Meal Prep- I  Eating- I  Maintaining continence- I  Transferring/Ambulation- I  Managing Meds- I   Follow up appointments reviewed:  PCP Hospital f/u appt confirmed? No   Specialist Hospital f/u appt confirmed? Yes  Scheduled to see [facility] Nurse on 10/02/2020 @ 1:30pm. Are transportation arrangements needed? No  If their condition worsens, is the pt aware to call PCP or go to the Emergency Dept.? Yes Was the patient provided with contact information for the PCP's office or ED? Yes Was to pt encouraged to call back with questions or concerns? Yes

## 2020-09-30 ENCOUNTER — Encounter: Payer: Self-pay | Admitting: *Deleted

## 2020-09-30 LAB — T.PALLIDUM AB, TOTAL: T Pallidum Abs: NONREACTIVE

## 2020-10-02 ENCOUNTER — Ambulatory Visit (INDEPENDENT_AMBULATORY_CARE_PROVIDER_SITE_OTHER): Payer: Medicaid Other

## 2020-10-02 ENCOUNTER — Other Ambulatory Visit: Payer: Self-pay

## 2020-10-02 VITALS — BP 143/95 | HR 97 | Ht 65.0 in | Wt 197.4 lb

## 2020-10-02 DIAGNOSIS — Z8759 Personal history of other complications of pregnancy, childbirth and the puerperium: Secondary | ICD-10-CM | POA: Diagnosis not present

## 2020-10-02 DIAGNOSIS — Z8679 Personal history of other diseases of the circulatory system: Secondary | ICD-10-CM

## 2020-10-02 DIAGNOSIS — Z0130 Encounter for examination of blood pressure without abnormal findings: Secondary | ICD-10-CM

## 2020-10-02 MED ORDER — LISINOPRIL 10 MG PO TABS: 10.0000 mg | ORAL_TABLET | Freq: Every day | ORAL | 1 refills | Status: DC

## 2020-10-02 NOTE — Progress Notes (Signed)
Agree with A & P. 

## 2020-10-02 NOTE — Progress Notes (Signed)
Pt here today for 1 week PP BP check since NVD on 09/24/20. Pt c/o possible side effects of Procardia. Pt states after taking Procaria has symptoms that  include dizziness, face flushing/heat , headache, and heart palpitations  for 30-45 mins. Pt has been taking x 1 week since having increased dosage to 60mg  of Procardia. Pt states has not taken BP med today. Pt denies all other s/s of HTN.   BP today: 143/95   Reviewed with Dr . Advised to stop taking Procardia. Change to Lisinopril 10mg  qd #30, 1 RF. Rx sent to pharmacy on file.  Have BP check in 1 week. Scheduled for 8/18 at 1020am. Change PP appt to MD. Get CBC and CMP today. Pt agreeable to plan of care and verbalized understanding.   , RN

## 2020-10-03 LAB — COMPREHENSIVE METABOLIC PANEL
ALT: 30 IU/L (ref 0–32)
AST: 34 IU/L (ref 0–40)
Albumin/Globulin Ratio: 1.7 (ref 1.2–2.2)
Albumin: 4.3 g/dL (ref 3.8–4.8)
Alkaline Phosphatase: 123 IU/L — ABNORMAL HIGH (ref 44–121)
BUN/Creatinine Ratio: 15 (ref 9–23)
BUN: 11 mg/dL (ref 6–20)
Bilirubin Total: 0.2 mg/dL (ref 0.0–1.2)
CO2: 21 mmol/L (ref 20–29)
Calcium: 9.7 mg/dL (ref 8.7–10.2)
Chloride: 104 mmol/L (ref 96–106)
Creatinine, Ser: 0.73 mg/dL (ref 0.57–1.00)
Globulin, Total: 2.6 g/dL (ref 1.5–4.5)
Glucose: 78 mg/dL (ref 65–99)
Potassium: 4.2 mmol/L (ref 3.5–5.2)
Sodium: 141 mmol/L (ref 134–144)
Total Protein: 6.9 g/dL (ref 6.0–8.5)
eGFR: 113 mL/min/{1.73_m2} (ref >59)

## 2020-10-03 LAB — CBC
Hematocrit: 38.4 % (ref 34.0–46.6)
Hemoglobin: 11.9 g/dL (ref 11.1–15.9)
MCH: 23 pg — ABNORMAL LOW (ref 26.6–33.0)
MCHC: 31 g/dL — ABNORMAL LOW (ref 31.5–35.7)
MCV: 74 fL — ABNORMAL LOW (ref 79–97)
Platelets: 411 10*3/uL (ref 150–450)
RBC: 5.18 x10E6/uL (ref 3.77–5.28)
RDW: 19.2 % — ABNORMAL HIGH (ref 11.7–15.4)
WBC: 5.4 10*3/uL (ref 3.4–10.8)

## 2020-10-08 ENCOUNTER — Telehealth (HOSPITAL_COMMUNITY): Payer: Self-pay

## 2020-10-08 NOTE — Telephone Encounter (Signed)
No answer. Left message to return nurse call.  Marcelino Duster [facility] 10/08/2020,1840

## 2020-10-09 ENCOUNTER — Ambulatory Visit (INDEPENDENT_AMBULATORY_CARE_PROVIDER_SITE_OTHER): Payer: Medicaid Other

## 2020-10-09 ENCOUNTER — Other Ambulatory Visit: Payer: Self-pay

## 2020-10-09 VITALS — BP 105/75 | HR 79 | Ht 65.0 in | Wt 193.0 lb

## 2020-10-09 DIAGNOSIS — Z0130 Encounter for examination of blood pressure without abnormal findings: Secondary | ICD-10-CM

## 2020-10-09 DIAGNOSIS — O119 Pre-existing hypertension with pre-eclampsia, unspecified trimester: Secondary | ICD-10-CM

## 2020-10-09 NOTE — Progress Notes (Signed)
Pt here today for PP BP check. Pt delivered via NSVD on 09/24/20. Pt had hx of CHTN with superimposed pre-E.  Pt had change in Rx Procardia to Lisinopril on 10/02/20. Pt denies any headaches, visual changes or swelling. Pt states feels better and good with taking the Lisinopril Rx.  Pt verbalized understanding.   BP: 105/75  Pt advised to continue to take Lisinopril Rx until PP appt on 9/7. Pt agreeable to plan of care.   Judeth Cornfield, RN

## 2020-10-10 NOTE — Progress Notes (Signed)
Chart reviewed for nurse visit. Agree with plan of care.   Venia Carbon I, NP 10/10/2020 10:35 AM

## 2020-10-26 ENCOUNTER — Ambulatory Visit (HOSPITAL_COMMUNITY)
Admission: EM | Admit: 2020-10-26 | Discharge: 2020-10-26 | Disposition: A | Discharge status: Home / Self Care | Payer: Medicaid Other | Attending: Physician Assistant | Admitting: Physician Assistant

## 2020-10-26 ENCOUNTER — Encounter (HOSPITAL_COMMUNITY): Payer: Self-pay

## 2020-10-26 DIAGNOSIS — L509 Urticaria, unspecified: Secondary | ICD-10-CM | POA: Diagnosis not present

## 2020-10-26 MED ORDER — METHYLPREDNISOLONE SODIUM SUCC 125 MG IJ SOLR
60.0000 mg | Freq: Once | INTRAMUSCULAR | Status: AC
Start: 2020-10-26 — End: 2020-10-26
  Administered 2020-10-26: 60 mg via INTRAMUSCULAR

## 2020-10-26 MED ORDER — METHYLPREDNISOLONE SODIUM SUCC 125 MG IJ SOLR
INTRAMUSCULAR | Status: AC
  Filled 2020-10-26: qty 2

## 2020-10-26 NOTE — Discharge Instructions (Addendum)
Try xyzal OTC, and follow up with derm as soon as possible. Follow up with any further concerns.

## 2020-10-26 NOTE — ED Triage Notes (Signed)
Pt presents with intermittent hives over the past few months from unknown source that is unrelieved with OTC antihistamines.

## 2020-10-26 NOTE — ED Provider Notes (Signed)
[facility]    CSN: [id number] Arrival date & time: 10/26/20  1122      History   Chief Complaint Chief Complaint  Patient presents with   Urticaria    HPI Melissa Howell is a 30 y.o. female.   Patient here today for evaluation of recurrent hives that she has had since she was pregnant with her son.  Rashes present to her face, arms, trunk, bilateral legs.  She reports that initially she was prescribed Zyrtec, which did work some, however has stopped working.  She has also tried Claritin without significant relief.  She states that hives occur randomly, and she denies any new body care items, including lotion, body wash, detergents.  She has also had testing through her PCP for common allergies without any determination of etiology of urticaria.  She has not seen a dermatologist at this point.  She denies any fever, chills.  She has not had any shortness of breath or difficulty swallowing.  The history is provided by the patient.  Urticaria Pertinent negatives include no shortness of breath.   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 No pertinent surgical history.  OB History     Gravida  2   Para  2   Term  2   Preterm  0   AB  0   Living  2      SAB  0   IAB  0   Ectopic  0   Multiple  0   Live Births  2            Home Medications    Prior to Admission medications   Medication Sig Start Date End Date Taking? Authorizing Provider  diphenhydrAMINE (BENADRYL) 25 mg capsule  10/06/20   [provider]  ibuprofen (ADVIL) 600 MG  tablet Take 1 tablet (600 mg total) by mouth every 6 (six) hours. 09/26/20   Cresenzo-Dishmon, Scarlette Calico, CNM  lisinopril (ZESTRIL) 10 MG tablet Take 1 tablet (10 mg total) by mouth daily. 10/02/20   Hermina Staggers, MD    Family History Family History  Problem Relation Age of Onset   Healthy Mother    Hypertension Mother    Diabetes Paternal Grandfather    Hypertension Paternal Grandfather     Social History Social History   Tobacco Use   Smoking status: Never   Smokeless tobacco: Never  Vaping Use   Vaping Use: Never used  Substance Use Topics   Alcohol use: Not Currently    Comment: OCC   Drug use: No     Allergies   Patient has no known allergies.   Review of Systems Review of Systems  Constitutional:  Negative for chills and fever.  HENT:  Negative for drooling and trouble swallowing.   Eyes:  Negative for discharge and redness.  Respiratory:  Negative for shortness of breath and wheezing.   Gastrointestinal:  Negative for nausea and vomiting.  Skin:  Positive for rash.    Physical Exam Triage Vital Signs ED Triage Vitals  Enc Vitals Group  BP 10/26/20 1218 127/76     Pulse Rate 10/26/20 1218 (!) 116     Resp --      Temp 10/26/20 1218 98.5 F (36.9 C)     Temp Source 10/26/20 1218 Oral     SpO2 10/26/20 1218 99 %     Weight --      Height --      Head Circumference --      Peak Flow --      Pain Score 10/26/20 1231 0     Pain Loc --      Pain Edu? --      Excl. in GC? --    No data found.  Updated Vital Signs BP 127/76 (BP Location: Left Arm)   Pulse (!) 116   Temp 98.5 F (36.9 C) (Oral)   LMP  (LMP Unknown)   SpO2 99%   Breastfeeding Yes      Physical Exam Vitals and nursing note reviewed.  Constitutional:      General: She is not in acute distress.    Appearance: Normal appearance. She is not ill-appearing.  HENT:     Head: Normocephalic and atraumatic.     Nose: Nose normal.  Cardiovascular:     Rate and Rhythm: Normal rate  and regular rhythm.     Heart sounds: Normal heart sounds. No murmur heard. Pulmonary:     Effort: Pulmonary effort is normal. No respiratory distress.     Breath sounds: Normal breath sounds. No wheezing, rhonchi or rales.  Skin:    General: Skin is warm and dry.     Findings: Rash (erythematous urticarial rash present diffusely to bilateral arms, face) present.  Neurological:     Mental Status: She is alert.  Psychiatric:        Mood and Affect: Mood normal.        Thought Content: Thought content normal.     UC Treatments / Results  Labs (all labs ordered are listed, but only abnormal results are displayed) Labs Reviewed - No data to display  EKG   Radiology No results found.  Procedures Procedures (including critical care time)  Medications Ordered in UC Medications  methylPREDNISolone sodium succinate (SOLU-MEDROL) 125 mg/2 mL injection 60 mg (has no administration in time range)    Initial Impression / Assessment and Plan / UC Course  I have reviewed the triage vital signs and the nursing notes.  Pertinent labs & imaging results that were available during my care of the patient were reviewed by me and considered in my medical decision making (see chart for details).  Solu-Medrol injection administered in office today, and recommended further evaluation by dermatology.  Contact information provided for same.  Encouraged her to try over-the-counter Xyzal.  Recommended follow-up with any further concerns.  Final Clinical Impressions(s) / UC Diagnoses   Final diagnoses:  Hives     Discharge Instructions      Try xyzal OTC, and follow up with derm as soon as possible. Follow up with any further concerns.      ED Prescriptions   None    PDMP not reviewed this encounter.   Tomi Bamberger, PA-C 10/26/20 1258

## 2020-10-29 ENCOUNTER — Ambulatory Visit: Payer: Medicaid Other | Admitting: Nurse Practitioner

## 2020-10-29 ENCOUNTER — Other Ambulatory Visit: Payer: Self-pay

## 2020-10-29 ENCOUNTER — Ambulatory Visit (INDEPENDENT_AMBULATORY_CARE_PROVIDER_SITE_OTHER): Payer: Medicaid Other | Admitting: Obstetrics & Gynecology

## 2020-10-29 ENCOUNTER — Encounter: Payer: Self-pay | Admitting: Obstetrics & Gynecology

## 2020-10-29 DIAGNOSIS — O119 Pre-existing hypertension with pre-eclampsia, unspecified trimester: Secondary | ICD-10-CM

## 2020-10-29 NOTE — Progress Notes (Signed)
Post Partum Visit Note  Melissa Howell is a 30 y.o. G43P2002 female who presents for a postpartum visit. She is 4 weeks postpartum following a normal spontaneous vaginal delivery.  I have fully reviewed the prenatal and intrapartum course, had chronic hypertension with superimposed preeclampsia. The delivery was at 38 gestational weeks.  Anesthesia: epidural. Postpartum course has been complicated by ongoing rash, has been present all through pregnancy, and pending Dermatology evaluation. Baby is doing well. Baby is feeding by breast. Bleeding no bleeding. Bowel function is normal. Bladder function is normal. Patient is not sexually active. Contraception method is none. Postpartum depression screening: negative.   The pregnancy intention screening data noted above was reviewed. Potential methods of contraception were discussed. The patient elected to proceed with No data recorded.   Edinburgh Postnatal Depression Scale - 10/29/20 1011       Edinburgh Postnatal Depression Scale:  In the Past 7 Days   I have been able to laugh and see the funny side of things. 0    I have looked forward with enjoyment to things. 0    I have blamed myself unnecessarily when things went wrong. 0    I have been anxious or worried for no good reason. 0    I have felt scared or panicky for no good reason. 0    Things have been getting on top of me. 0    I have been so unhappy that I have had difficulty sleeping. 0    I have felt sad or miserable. 0    I have been so unhappy that I have been crying. 0    The thought of harming myself has occurred to me. 0    Edinburgh Postnatal Depression Scale Total 0             There are no preventive care reminders to display for this patient.   The following portions of the patient's history were reviewed and updated as appropriate: allergies, current medications, past family history, past medical history, past social history, past surgical history, and problem  list.  Review of Systems Pertinent items noted in HPI and remainder of comprehensive ROS otherwise negative.  Objective:  BP 134/80   Pulse 92   Ht 5\' 5"  (1.651 m)   Wt 186 lb 9.6 oz (84.6 kg)   LMP  (LMP Unknown)   Breastfeeding Yes   BMI 31.05 kg/m    General:  alert and no distress   Breasts:  normal  Lungs: clear to auscultation bilaterally  Heart:  regular rate and rhythm  Abdomen: soft, non-tender; bowel sounds normal; no masses,  no organomegaly   GU exam:  not indicated       Assessment:   1. Postpartum care following vaginal delivery 2. Chronic hypertension with superimposed pre-eclampsia Normal postpartum exam.   Plan:   Essential components of care per ACOG recommendations:  1.  Mood and well being: Patient with negative depression screening today. Reviewed local resources for support.  - Patient tobacco use? No.   - hx of drug use? No.    2. Infant care and feeding:  -Patient currently breastmilk feeding? Yes. No concerns. -Social determinants of health (SDOH) reviewed in EPIC.   3. Sexuality, contraception and birth spacing - Patient does not want a pregnancy in the next year.  Desired family size is 2 children.  - Reviewed forms of contraception in tiered fashion. Patient desired condoms, natural family planning (NFP) today, but is thinking  about other options. - Discussed birth spacing of 18 months  4. Sleep and fatigue -Encouraged family/partner/community support of 4 hrs of uninterrupted sleep to help with mood and fatigue  5. Physical Recovery  - Discussed patients delivery and complications. She describes her labor as good. - Patient had a Vaginal, no problems at delivery. Patient had no laceration. Perineal healing reviewed. Patient expressed understanding - Patient has urinary incontinence? No. - Patient is not safe to resume physical and sexual activity, wait two more weeks.  6.  Health Maintenance - HM due items addressed Yes - Last pap  smear  Diagnosis  Date Value Ref Range Status  05/05/2020   Final   - Negative for intraepithelial lesion or malignancy (NILM)   Pap smear not done at today's visit.  -Breast Cancer screening indicated? No.   7. Rash  - To be seen by Dermatology  8. CHTN:  - Lisinopril to be continued for now - PCP follow up  Jaynie Collins, MD [facility] for [facility], [facility]

## 2020-11-04 ENCOUNTER — Ambulatory Visit
Admission: EM | Admit: 2020-11-04 | Discharge: 2020-11-04 | Disposition: A | Discharge status: Home / Self Care | Payer: Medicaid Other

## 2020-11-04 ENCOUNTER — Other Ambulatory Visit: Payer: Self-pay

## 2020-11-04 DIAGNOSIS — L508 Other urticaria: Secondary | ICD-10-CM

## 2020-11-04 MED ORDER — FAMOTIDINE 20 MG PO TABS: 20.0000 mg | ORAL_TABLET | Freq: Two times a day (BID) | ORAL | 2 refills | Status: DC

## 2020-11-04 MED ORDER — MONTELUKAST SODIUM 10 MG PO TABS: 10.0000 mg | ORAL_TABLET | Freq: Every day | ORAL | 2 refills | Status: AC

## 2020-11-04 NOTE — ED Provider Notes (Signed)
[facility]    CSN: [id number] Arrival date & time: 11/04/20  1311      History   Chief Complaint Chief Complaint  Patient presents with   Allergic Reaction    HPI Melissa Howell is a 30 y.o. female.  She reports chronic, recurrent urticaria for the last 10 months since her second month of pregnancy.  Symptoms continued even after giving birth in early August.  Patient reports she had similar problems as a child, and saw an allergist, but the symptoms went away completely once she hit puberty.  Has tried Zyrtec, Claritin, and is now taking Allegra.  Was seen in urgent care 10/26/2020 and received an IM shot of Solu-Medrol that helped control symptoms until 2 days ago.  Rash is on face, neck, arms, and legs.    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 No pertinent surgical history.  OB History     Gravida  2   Para  2   Term  2   Preterm  0   AB  0   Living  2      SAB  0   IAB  0   Ectopic  0   Multiple  0   Live Births  2            Home Medications    Prior to Admission medications   Medication Sig Start Date End Date Taking? Authorizing Provider  diphenhydrAMINE (BENADRYL) 25 mg capsule  10/06/20  Yes [provider]  famotidine (PEPCID) 20 MG tablet Take 1 tablet (20 mg total) by mouth 2 (two) times daily. 11/04/20  Yes Cathlyn Parsons, NP  fexofenadine (ALLEGRA) 180 MG tablet Take 180 mg by mouth daily.   Yes [provider]  lisinopril (ZESTRIL) 10 MG tablet Take 1 tablet (10 mg total) by mouth  daily. 10/02/20  Yes Hermina Staggers, MD  montelukast (SINGULAIR) 10 MG tablet Take 1 tablet (10 mg total) by mouth at bedtime. 11/04/20  Yes Cathlyn Parsons, NP  ibuprofen (ADVIL) 600 MG tablet Take 1 tablet (600 mg total) by mouth every 6 (six) hours. 09/26/20   Cresenzo-Dishmon, Scarlette Calico, CNM    Family History Family History  Problem Relation Age of Onset   Healthy Mother    Hypertension Mother    Diabetes Paternal Grandfather    Hypertension Paternal Grandfather     Social History Social History   Tobacco Use   Smoking status: Never   Smokeless tobacco: Never  Vaping Use   Vaping Use: Never used  Substance Use Topics   Alcohol use: Not Currently    Comment: OCC   Drug use: No     Allergies   Patient has no known allergies.   Review of Systems Review of Systems   Physical Exam Triage Vital Signs ED Triage Vitals  Enc Vitals Group     BP 11/04/20 1343 125/82     Pulse Rate 11/04/20 1343 71     Resp 11/04/20 1343 18     Temp 11/04/20  1343 98.6 F (37 C)     Temp Source 11/04/20 1343 Oral     SpO2 11/04/20 1343 97 %     Weight --      Height --      Head Circumference --      Peak Flow --      Pain Score 11/04/20 1346 0     Pain Loc --      Pain Edu? --      Excl. in GC? --    No data found.  Updated Vital Signs BP 125/82 (BP Location: Right Arm)   Pulse 71   Temp 98.6 F (37 C) (Oral)   Resp 18   SpO2 97%   Breastfeeding Yes   Visual Acuity Right Eye Distance:   Left Eye Distance:   Bilateral Distance:    Right Eye Near:   Left Eye Near:    Bilateral Near:     Physical Exam Constitutional:      Appearance: Normal appearance.  Pulmonary:     Effort: Pulmonary effort is normal.  Skin:    Comments: Slightly raised, slightly erythematous patches to bilateral arms, neck, and face.  I did not undress patient to see all of her skin.  Neurological:     Mental Status: She is alert.     Gait: Gait normal.     UC Treatments / Results   Labs (all labs ordered are listed, but only abnormal results are displayed) Labs Reviewed - No data to display   Initial Impression / Assessment and Plan / UC Course  I have reviewed the triage vital signs and the nursing notes.  Pertinent labs & imaging results that were available during my care of the patient were reviewed by me and considered in my medical decision making (see chart for details).    Patient symptoms and physical exam are consistent with a chronic idiopathic urticaria.  She is already taking Allegra.  I will add Pepcid and Singulair.  She is to see dermatology at the end of November.  She can take Benadryl as needed if itching becomes severe.  Final Clinical Impressions(s) / UC Diagnoses   Final diagnoses:  Chronic urticaria     Discharge Instructions      Continue taking Allegra every day as you have been.  If the itching becomes severe, you can take Benadryl as needed per the directions on the bottle.  Add Pepcid 20mg  twice a day-I have sent in a prescription for this (generic over-the-counter famotidine is ok to take).  Also add Singulair (montelukast) 10 mg every day.  Follow-up with a dermatologist as scheduled.   ED Prescriptions     Medication Sig Dispense Auth. Provider   montelukast (SINGULAIR) 10 MG tablet Take 1 tablet (10 mg total) by mouth at bedtime. 30 tablet , NP   famotidine (PEPCID) 20 MG tablet Take 1 tablet (20 mg total) by mouth 2 (two) times daily. 30 tablet Cathlyn Parsons, NP      PDMP not reviewed this encounter.   Cathlyn Parsons, NP 11/04/20 1443

## 2020-11-04 NOTE — Discharge Instructions (Addendum)
Continue taking Allegra every day as you have been.  If the itching becomes severe, you can take Benadryl as needed per the directions on the bottle.  Add Pepcid 20mg  twice a day-I have sent in a prescription for this (generic over-the-counter famotidine is ok to take).  Also add Singulair (montelukast) 10 mg every day.  Follow-up with a dermatologist as scheduled.

## 2020-11-04 NOTE — ED Triage Notes (Signed)
Pt reports daily hives that started during pregnancy.  Was relieved somewhat by OTC meds during pregnancy now they are not working.  Received steroid shot at [facility] 09/04 that relieved hives x approx one week.  Hives returned two days ago.  Worse in am and improved in evening.  Some mild lip swelling started approx 3 weeks ago.  Denies airway involvement at any point. Has changed bedsheets and detergent.  Has dermatology appt in November.

## 2020-12-29 ENCOUNTER — Encounter: Payer: Self-pay | Admitting: Obstetrics & Gynecology

## 2020-12-29 ENCOUNTER — Other Ambulatory Visit: Payer: Self-pay

## 2020-12-29 ENCOUNTER — Ambulatory Visit (INDEPENDENT_AMBULATORY_CARE_PROVIDER_SITE_OTHER): Payer: Medicaid Other | Admitting: Obstetrics & Gynecology

## 2020-12-29 VITALS — BP 138/82 | HR 84 | Wt 182.0 lb

## 2020-12-29 DIAGNOSIS — O119 Pre-existing hypertension with pre-eclampsia, unspecified trimester: Secondary | ICD-10-CM

## 2020-12-29 DIAGNOSIS — Z3009 Encounter for other general counseling and advice on contraception: Secondary | ICD-10-CM | POA: Diagnosis not present

## 2020-12-29 MED ORDER — LISINOPRIL 10 MG PO TABS: 10.0000 mg | ORAL_TABLET | Freq: Every day | ORAL | 1 refills | Status: DC

## 2020-12-29 NOTE — Progress Notes (Signed)
Cc: discuss birth control G2P2002 No LMP recorded.  Patient is nursing and considering BC options. She has used Nexplanon before. Baby is doing well. Reviewed all forms of birth control options available including abstinence; fertility period awareness methods; over the counter/barrier methods; hormonal contraceptive medication including pill, patch, ring, injection,contraceptive implant; hormonal and nonhormonal IUDs; permanent sterilization options including vasectomy and the various tubal sterilization modalities were not discussed . Risks and benefits reviewed.  Questions were answered.  Information was given to patient to review. If she wants IUD she should premedicate with NSAID. 10 minutes face to face and coordination of care.  Adam Phenix, MD 12/29/2020

## 2021-01-27 DIAGNOSIS — E6609 Other obesity due to excess calories: Secondary | ICD-10-CM | POA: Diagnosis not present

## 2021-01-27 DIAGNOSIS — L508 Other urticaria: Secondary | ICD-10-CM | POA: Diagnosis not present

## 2021-01-27 DIAGNOSIS — I10 Essential (primary) hypertension: Secondary | ICD-10-CM | POA: Diagnosis not present

## 2021-01-27 DIAGNOSIS — Z6830 Body mass index (BMI) 30.0-30.9, adult: Secondary | ICD-10-CM | POA: Diagnosis not present

## 2021-01-27 DIAGNOSIS — R7989 Other specified abnormal findings of blood chemistry: Secondary | ICD-10-CM | POA: Diagnosis not present

## 2021-01-27 DIAGNOSIS — E611 Iron deficiency: Secondary | ICD-10-CM | POA: Diagnosis not present

## 2021-02-20 DIAGNOSIS — I10 Essential (primary) hypertension: Secondary | ICD-10-CM | POA: Diagnosis not present

## 2021-02-20 DIAGNOSIS — D509 Iron deficiency anemia, unspecified: Secondary | ICD-10-CM | POA: Diagnosis not present

## 2021-05-26 DIAGNOSIS — I10 Essential (primary) hypertension: Secondary | ICD-10-CM | POA: Diagnosis not present

## 2021-05-26 DIAGNOSIS — L309 Dermatitis, unspecified: Secondary | ICD-10-CM | POA: Diagnosis not present

## 2021-05-26 DIAGNOSIS — D509 Iron deficiency anemia, unspecified: Secondary | ICD-10-CM | POA: Diagnosis not present

## 2021-09-26 ENCOUNTER — Other Ambulatory Visit: Payer: Self-pay | Admitting: Family Medicine

## 2021-09-26 DIAGNOSIS — O99013 Anemia complicating pregnancy, third trimester: Secondary | ICD-10-CM

## 2022-01-18 LAB — OB RESULTS CONSOLE HIV ANTIBODY (ROUTINE TESTING): HIV: NONREACTIVE

## 2022-01-18 LAB — OB RESULTS CONSOLE RUBELLA ANTIBODY, IGM: Rubella: IMMUNE

## 2022-01-18 LAB — OB RESULTS CONSOLE RPR: RPR: NONREACTIVE

## 2022-01-18 LAB — OB RESULTS CONSOLE GC/CHLAMYDIA
Chlamydia: NEGATIVE
Neisseria Gonorrhea: NEGATIVE

## 2022-01-18 LAB — OB RESULTS CONSOLE ABO/RH: RH Type: NEGATIVE

## 2022-01-18 LAB — OB RESULTS CONSOLE HEPATITIS B SURFACE ANTIGEN: Hepatitis B Surface Ag: NEGATIVE

## 2022-01-18 LAB — HEPATITIS C ANTIBODY: HCV Ab: NEGATIVE

## 2022-01-18 LAB — OB RESULTS CONSOLE ANTIBODY SCREEN: Antibody Screen: NEGATIVE

## 2022-01-28 ENCOUNTER — Encounter: Payer: Self-pay | Admitting: *Deleted

## 2022-02-22 NOTE — L&D Delivery Note (Signed)
Delivery Note Standby for MD en route  At 6:50 PM a viable female was delivered via Vaginal, Spontaneous (Presentation: Left Occiput Transverse).  APGAR: 9, 9; weight  pending.   Placenta status: Spontaneous;Expressed, Intact.  Cord: 3 vessels with the following complications: True Knot.  Cord pH: not done  Anesthesia: Epidural Episiotomy: None Lacerations: None Suture Repair:  NA Est. Blood Loss (mL): 100  Mom to postpartum.  Baby Tai to Couplet care / Skin to Skin.  Juliene Pina, CNM 03/12/2022, 7:10 PM

## 2022-03-03 LAB — OB RESULTS CONSOLE GBS: GBS: NEGATIVE

## 2022-03-05 ENCOUNTER — Telehealth (HOSPITAL_COMMUNITY): Payer: Self-pay | Admitting: *Deleted

## 2022-03-05 ENCOUNTER — Encounter (HOSPITAL_COMMUNITY): Payer: Self-pay | Admitting: *Deleted

## 2022-03-05 NOTE — Telephone Encounter (Signed)
Preadmission screen  

## 2022-03-10 ENCOUNTER — Other Ambulatory Visit: Payer: Self-pay | Admitting: Obstetrics and Gynecology

## 2022-03-12 ENCOUNTER — Encounter (HOSPITAL_COMMUNITY): Payer: Self-pay | Admitting: Obstetrics and Gynecology

## 2022-03-12 ENCOUNTER — Inpatient Hospital Stay (HOSPITAL_COMMUNITY): Payer: 59

## 2022-03-12 ENCOUNTER — Inpatient Hospital Stay (HOSPITAL_COMMUNITY): Payer: 59 | Admitting: Anesthesiology

## 2022-03-12 ENCOUNTER — Inpatient Hospital Stay (HOSPITAL_COMMUNITY)
Admission: RE | Admit: 2022-03-12 | Discharge: 2022-03-14 | DRG: 806 | Disposition: A | Discharge status: Home / Self Care | Payer: 59 | Attending: Obstetrics and Gynecology | Admitting: Obstetrics and Gynecology

## 2022-03-12 DIAGNOSIS — Z3490 Encounter for supervision of normal pregnancy, unspecified, unspecified trimester: Secondary | ICD-10-CM

## 2022-03-12 DIAGNOSIS — O26893 Other specified pregnancy related conditions, third trimester: Secondary | ICD-10-CM | POA: Diagnosis present

## 2022-03-12 DIAGNOSIS — O1493 Unspecified pre-eclampsia, third trimester: Secondary | ICD-10-CM | POA: Diagnosis present

## 2022-03-12 DIAGNOSIS — O9902 Anemia complicating childbirth: Secondary | ICD-10-CM | POA: Diagnosis present

## 2022-03-12 DIAGNOSIS — O9812 Syphilis complicating childbirth: Secondary | ICD-10-CM | POA: Diagnosis present

## 2022-03-12 DIAGNOSIS — A539 Syphilis, unspecified: Secondary | ICD-10-CM | POA: Diagnosis present

## 2022-03-12 DIAGNOSIS — Z9289 Personal history of other medical treatment: Secondary | ICD-10-CM

## 2022-03-12 DIAGNOSIS — Z6791 Unspecified blood type, Rh negative: Secondary | ICD-10-CM | POA: Diagnosis not present

## 2022-03-12 DIAGNOSIS — O1404 Mild to moderate pre-eclampsia, complicating childbirth: Principal | ICD-10-CM | POA: Diagnosis present

## 2022-03-12 DIAGNOSIS — Z3A37 37 weeks gestation of pregnancy: Secondary | ICD-10-CM | POA: Diagnosis not present

## 2022-03-12 LAB — CBC
HCT: 37.6 % (ref 36.0–46.0)
Hemoglobin: 12.4 g/dL (ref 12.0–15.0)
MCH: 28.9 pg (ref 26.0–34.0)
MCHC: 33 g/dL (ref 30.0–36.0)
MCV: 87.6 fL (ref 80.0–100.0)
Platelets: 241 10*3/uL (ref 150–400)
RBC: 4.29 MIL/uL (ref 3.87–5.11)
RDW: 15 % (ref 11.5–15.5)
WBC: 5.9 10*3/uL (ref 4.0–10.5)
nRBC: 0 % (ref 0.0–0.2)

## 2022-03-12 LAB — COMPREHENSIVE METABOLIC PANEL
ALT: 17 U/L (ref 0–44)
AST: 22 U/L (ref 15–41)
Albumin: 2.9 g/dL — ABNORMAL LOW (ref 3.5–5.0)
Alkaline Phosphatase: 100 U/L (ref 38–126)
Anion gap: 11 (ref 5–15)
BUN: 6 mg/dL (ref 6–20)
CO2: 21 mmol/L — ABNORMAL LOW (ref 22–32)
Calcium: 9.1 mg/dL (ref 8.9–10.3)
Chloride: 102 mmol/L (ref 98–111)
Creatinine, Ser: 0.49 mg/dL (ref 0.44–1.00)
GFR, Estimated: >60 mL/min (ref >60)
Glucose, Bld: 94 mg/dL (ref 70–99)
Potassium: 4 mmol/L (ref 3.5–5.1)
Sodium: 134 mmol/L — ABNORMAL LOW (ref 135–145)
Total Bilirubin: 0.2 mg/dL — ABNORMAL LOW (ref 0.3–1.2)
Total Protein: 6.6 g/dL (ref 6.5–8.1)

## 2022-03-12 LAB — TYPE AND SCREEN
ABO/RH(D): O NEG
Antibody Screen: POSITIVE

## 2022-03-12 LAB — RPR
RPR Ser Ql: REACTIVE — AB
RPR Titer: 1:1 {titer}

## 2022-03-12 MED ORDER — BISACODYL 10 MG RE SUPP: 10.0000 mg | Freq: Every day | RECTAL | Status: DC | PRN

## 2022-03-12 MED ORDER — EPHEDRINE 5 MG/ML INJ: 10.0000 mg | INTRAVENOUS | Status: DC | PRN

## 2022-03-12 MED ORDER — COCONUT OIL OIL: 1.0000 | TOPICAL_OIL | Status: DC | PRN

## 2022-03-12 MED ORDER — FENTANYL-BUPIVACAINE-NACL 0.5-0.125-0.9 MG/250ML-% EP SOLN
12.0000 mL/h | EPIDURAL | Status: DC | PRN
  Administered 2022-03-12: 12 mL/h via EPIDURAL
  Filled 2022-03-12: qty 250

## 2022-03-12 MED ORDER — ACETAMINOPHEN 325 MG PO TABS
650.0000 mg | ORAL_TABLET | ORAL | Status: DC | PRN
  Administered 2022-03-12 (×2): 650 mg via ORAL
  Filled 2022-03-12 (×2): qty 2

## 2022-03-12 MED ORDER — LACTATED RINGERS IV SOLN
500.0000 mL | Freq: Once | INTRAVENOUS | Status: AC
  Administered 2022-03-12: 500 mL via INTRAVENOUS

## 2022-03-12 MED ORDER — OXYCODONE-ACETAMINOPHEN 5-325 MG PO TABS: 2.0000 | ORAL_TABLET | ORAL | Status: DC | PRN

## 2022-03-12 MED ORDER — LACTATED RINGERS IV SOLN: 500.0000 mL | INTRAVENOUS | Status: DC | PRN

## 2022-03-12 MED ORDER — SIMETHICONE 80 MG PO CHEW: 80.0000 mg | CHEWABLE_TABLET | ORAL | Status: DC | PRN

## 2022-03-12 MED ORDER — LIDOCAINE HCL (PF) 1 % IJ SOLN: 30.0000 mL | INTRAMUSCULAR | Status: DC | PRN

## 2022-03-12 MED ORDER — OXYTOCIN BOLUS FROM INFUSION
333.0000 mL | Freq: Once | INTRAVENOUS | Status: AC
  Administered 2022-03-12: 333 mL via INTRAVENOUS

## 2022-03-12 MED ORDER — OXYTOCIN-SODIUM CHLORIDE 30-0.9 UT/500ML-% IV SOLN
2.5000 [IU]/h | INTRAVENOUS | Status: DC
  Administered 2022-03-12: 2.5 [IU]/h via INTRAVENOUS

## 2022-03-12 MED ORDER — ONDANSETRON HCL 4 MG PO TABS: 4.0000 mg | ORAL_TABLET | ORAL | Status: DC | PRN

## 2022-03-12 MED ORDER — DIPHENHYDRAMINE HCL 50 MG/ML IJ SOLN: 12.5000 mg | INTRAMUSCULAR | Status: DC | PRN

## 2022-03-12 MED ORDER — ONDANSETRON HCL 4 MG/2ML IJ SOLN
4.0000 mg | Freq: Four times a day (QID) | INTRAMUSCULAR | Status: DC | PRN
  Administered 2022-03-12: 4 mg via INTRAVENOUS
  Filled 2022-03-12: qty 2

## 2022-03-12 MED ORDER — BENZOCAINE-MENTHOL 20-0.5 % EX AERO
1.0000 | INHALATION_SPRAY | CUTANEOUS | Status: DC | PRN
  Administered 2022-03-12: 1 via TOPICAL
  Filled 2022-03-12: qty 56

## 2022-03-12 MED ORDER — OXYTOCIN-SODIUM CHLORIDE 30-0.9 UT/500ML-% IV SOLN
1.0000 m[IU]/min | INTRAVENOUS | Status: DC
  Administered 2022-03-12: 2 m[IU]/min via INTRAVENOUS
  Filled 2022-03-12: qty 500

## 2022-03-12 MED ORDER — SOD CITRATE-CITRIC ACID 500-334 MG/5ML PO SOLN: 30.0000 mL | ORAL | Status: DC | PRN

## 2022-03-12 MED ORDER — IBUPROFEN 600 MG PO TABS
600.0000 mg | ORAL_TABLET | Freq: Four times a day (QID) | ORAL | Status: DC
  Administered 2022-03-12 – 2022-03-14 (×7): 600 mg via ORAL
  Filled 2022-03-12 (×6): qty 1

## 2022-03-12 MED ORDER — SODIUM CHLORIDE 0.9 % IV SOLN
12.5000 mg | Freq: Once | INTRAVENOUS | Status: DC
  Filled 2022-03-12 (×2): qty 0.5

## 2022-03-12 MED ORDER — LACTATED RINGERS IV SOLN: INTRAVENOUS | Status: DC

## 2022-03-12 MED ORDER — TERBUTALINE SULFATE 1 MG/ML IJ SOLN: 0.2500 mg | Freq: Once | INTRAMUSCULAR | Status: DC | PRN

## 2022-03-12 MED ORDER — LIDOCAINE HCL (PF) 1 % IJ SOLN
INTRAMUSCULAR | Status: DC | PRN
  Administered 2022-03-12 (×2): 4 mL via EPIDURAL

## 2022-03-12 MED ORDER — DIBUCAINE (PERIANAL) 1 % EX OINT: 1.0000 | TOPICAL_OINTMENT | CUTANEOUS | Status: DC | PRN

## 2022-03-12 MED ORDER — SENNOSIDES-DOCUSATE SODIUM 8.6-50 MG PO TABS
2.0000 | ORAL_TABLET | ORAL | Status: DC
  Administered 2022-03-13 – 2022-03-14 (×2): 2 via ORAL
  Filled 2022-03-12 (×2): qty 2

## 2022-03-12 MED ORDER — WITCH HAZEL-GLYCERIN EX PADS: 1.0000 | MEDICATED_PAD | CUTANEOUS | Status: DC | PRN

## 2022-03-12 MED ORDER — FLEET ENEMA 7-19 GM/118ML RE ENEM: 1.0000 | ENEMA | Freq: Every day | RECTAL | Status: DC | PRN

## 2022-03-12 MED ORDER — FLEET ENEMA 7-19 GM/118ML RE ENEM: 1.0000 | ENEMA | RECTAL | Status: DC | PRN

## 2022-03-12 MED ORDER — DIPHENHYDRAMINE HCL 25 MG PO CAPS: 25.0000 mg | ORAL_CAPSULE | Freq: Four times a day (QID) | ORAL | Status: DC | PRN

## 2022-03-12 MED ORDER — ONDANSETRON HCL 4 MG/2ML IJ SOLN: 4.0000 mg | INTRAMUSCULAR | Status: DC | PRN

## 2022-03-12 MED ORDER — PHENYLEPHRINE 80 MCG/ML (10ML) SYRINGE FOR IV PUSH (FOR BLOOD PRESSURE SUPPORT): 80.0000 ug | PREFILLED_SYRINGE | INTRAVENOUS | Status: DC | PRN

## 2022-03-12 MED ORDER — PRENATAL MULTIVITAMIN CH
1.0000 | ORAL_TABLET | Freq: Every day | ORAL | Status: DC
  Administered 2022-03-13 – 2022-03-14 (×2): 1 via ORAL
  Filled 2022-03-12 (×2): qty 1

## 2022-03-12 MED ORDER — ZOLPIDEM TARTRATE 5 MG PO TABS: 5.0000 mg | ORAL_TABLET | Freq: Every evening | ORAL | Status: DC | PRN

## 2022-03-12 MED ORDER — OXYCODONE-ACETAMINOPHEN 5-325 MG PO TABS: 1.0000 | ORAL_TABLET | ORAL | Status: DC | PRN

## 2022-03-12 MED ORDER — ACETAMINOPHEN 500 MG PO TABS
1000.0000 mg | ORAL_TABLET | Freq: Four times a day (QID) | ORAL | Status: DC
  Administered 2022-03-12 – 2022-03-14 (×5): 1000 mg via ORAL
  Filled 2022-03-12 (×6): qty 2

## 2022-03-12 MED ORDER — TETANUS-DIPHTH-ACELL PERTUSSIS 5-2.5-18.5 LF-MCG/0.5 IM SUSY: 0.5000 mL | PREFILLED_SYRINGE | Freq: Once | INTRAMUSCULAR | Status: DC

## 2022-03-12 MED ORDER — ONDANSETRON HCL 4 MG/2ML IJ SOLN
4.0000 mg | Freq: Once | INTRAMUSCULAR | Status: AC
  Administered 2022-03-12: 4 mg via INTRAVENOUS
  Filled 2022-03-12: qty 2

## 2022-03-12 NOTE — Progress Notes (Signed)
Baby latched right breast at 1932. Popped off and on for a few seconds and then appeared to latch well and continues to suck at 1948.

## 2022-03-12 NOTE — Progress Notes (Signed)
Melissa Howell is a 32 y.o. G3P2002 at [redacted]w[redacted]d  IOL for preeclampsia diagnosed 2 weeks back with BP and urine p/c criteria  Subjective: No UCs   Objective: BP 138/78   Pulse (!) 110   Temp 98.8 F (37.1 C) (Oral)   Resp 18   Ht 5\' 5"  (1.651 m)   Wt 101.3 kg   SpO2 99%   BMI 37.16 kg/m   FHT:  FHR: 150 bpm, variability: moderate,  accelerations:  Present,  decelerations:  Absent UC:   none SVE:   Dilation: 1.5 Effacement (%): 50 Station: Ballotable Exam by:: Dr. Benjie Karvonen Cervical balloon placed, some bleeding noted but FHT cat I, will monitor   Labs: Lab Results  Component Value Date   WBC 5.9 03/12/2022   HGB 12.4 03/12/2022   HCT 37.6 03/12/2022   MCV 87.6 03/12/2022   PLT 241 03/12/2022   CMP ordered  Assessment / Plan: Induction of labor due to preeclampsia,  progressing well on pitocin, early labor. FHT cat I  Preeclampsia:  no signs or symptoms of toxicity Fetal Wellbeing:  Category I Pain Control:   wants epidural in active labor  I/D:  n/a Anticipated MOD:  NSVD  Elveria Royals, MD 03/12/2022, 9:46 AM

## 2022-03-12 NOTE — Progress Notes (Signed)
2030 Patient had phenergan ordered for nausea. Had still not received it from pharmacy at 2006. Called pharmacy at that time and it was sent up at 2030. By this time patient stated that she was no longer feeling sick. She was eating a sandwich at the time. Phenergan was held and transported to the floor with patient. Given to her nurse .Marland KitchenMarland KitchenLennie Muckle RN

## 2022-03-12 NOTE — H&P (Addendum)
Melissa Howell is a 32 y.o. G3P2002 at [redacted]w[redacted]d gestation presents for IOL for mild pre-eclampsia. No ctx,lof,vb on admission. No h/a, vision changes, ruq pain on admission.  Antepartum course: mild pre-eclampsia on labetalol 100mg  po bid; efw:  EFW 6'11" 58% AC 85%; late to care - unaware of pregnancy; fals positive RPR [facility] at [place] since 27 wks.  See complete pre-natal records  History OB History     Gravida  3   Para  2   Term  2   Preterm  0   AB  0   Living  2      SAB  0   IAB  0   Ectopic  0   Multiple  0   Live Births  2          Past Medical History:  Diagnosis Date   History of pre-eclampsia in prior pregnancy, currently pregnant    Hives    unsure why   Pregnancy induced hypertension    No past surgical history on file. Family History: family history includes Diabetes in her paternal grandfather; Healthy in her mother; Hypertension in her mother and paternal grandfather. Social History:  reports that she has never smoked. She has never used smokeless tobacco. She reports that she does not currently use alcohol. She reports that she does not use drugs.  ROS: See above otherwise negative  Prenatal labs:  ABO, Rh: O/Negative/-- (11/27 0000) Antibody: Negative (11/27 0000) Rubella: Immune (11/27 0000) RPR: Nonreactive (11/27 0000)  HBsAg: Negative (11/27 0000)  HIV:Non-reactive (11/27 0000)  GBS: Negative/-- (01/10 0000)  1 hr Glucola: Normal Genetic screening:  declined Anatomy US: Normal  Physical Exam:     Blood pressure 138/87, pulse (!) 121, temperature 98.8 F (37.1 C), temperature source Oral, resp. rate 18, height 5\' 5"  (1.651 m), weight 101.3 kg, SpO2 99 %, currently breastfeeding.  A&ox3 Rrr Ctab Abd; sof,nt,nd LE no edema, nt bilat   Labs:  pending  Prenatal Transfer Tool  Maternal Diabetes: No Genetic Screening: Declined Maternal Ultrasounds/Referrals: Normal Fetal Ultrasounds or other Referrals:   None Maternal Substance Abuse:  No Significant Maternal Medications:  Meds include: Other: labetalol Significant Maternal Lab Results: Group B Strep negative Number of Prenatal Visits:greater than 3 verified prenatal visits Other Comments:  None  FHT:150s, nml variability, +accels, no decels TOCO: rare  Assessment/Plan:  32 y.o. G3P2002 at [redacted]w[redacted]d gestation   Mild pre-eclampsia - check labs on admission - cbc/cmp/pc ratio; plan pitocin and balloon catheter; follow bps closely and watch for signs/symptoms severe pre-eclampsia Aga: 6'11"; proven pelvis 8'4" Gbs neg Late to care - pt was not aware of pregnancy Fals positive RPR   Charyl Bigger 03/12/2022, 8:30 AM  Addendum to add PE. See progress note as also documented there.

## 2022-03-12 NOTE — Anesthesia Procedure Notes (Signed)
Epidural Patient location during procedure: OB Start time: 03/12/2022 2:22 PM End time: 03/12/2022 2:25 PM  Staffing Anesthesiologist: Brennan Bailey, MD Performed: anesthesiologist   Preanesthetic Checklist Completed: patient identified, IV checked, risks and benefits discussed, monitors and equipment checked, pre-op evaluation and timeout performed  Epidural Patient position: sitting Prep: DuraPrep and site prepped and draped Patient monitoring: continuous pulse ox, blood pressure and heart rate Approach: midline Location: L3-L4 Injection technique: LOR air  Needle:  Needle type: Tuohy  Needle gauge: 17 G Needle length: 9 cm Needle insertion depth: 7 cm Catheter type: closed end flexible Catheter size: 19 Gauge Catheter at skin depth: 12 cm Test dose: negative and Other (1% lidocaine)  Assessment Events: blood not aspirated, no cerebrospinal fluid, injection not painful, no injection resistance, no paresthesia and negative IV test  Additional Notes Patient identified. Risks, benefits, and alternatives discussed with patient including but not limited to bleeding, infection, nerve damage, paralysis, failed block, incomplete pain control, headache, blood pressure changes, nausea, vomiting, reactions to medication, itching, and postpartum back pain. Confirmed with bedside nurse the patient's most recent platelet count. Confirmed with patient that they are not currently taking any anticoagulation, have any bleeding history, or any family history of bleeding disorders. Patient expressed understanding and wished to proceed. All questions were answered. Sterile technique was used throughout the entire procedure. Please see nursing notes for vital signs.   Crisp LOR on first pass. Test dose was given through epidural catheter and negative prior to continuing to dose epidural or start infusion. Warning signs of high block given to the patient including shortness of breath,  tingling/numbness in hands, complete motor block, or any concerning symptoms with instructions to call for help. Patient was given instructions on fall risk and not to get out of bed. All questions and concerns addressed with instructions to call with any issues or inadequate analgesia.  Reason for block:procedure for pain

## 2022-03-12 NOTE — Anesthesia Preprocedure Evaluation (Signed)
Anesthesia Evaluation  Patient identified by MRN, date of birth, ID band Patient awake    Reviewed: Allergy & Precautions, Patient's Chart, lab work & pertinent test results, reviewed documented beta blocker date and time   History of Anesthesia Complications Negative for: history of anesthetic complications  Airway Mallampati: II  TM Distance: >3 FB Neck ROM: Full    Dental no notable dental hx.    Pulmonary neg pulmonary ROS   Pulmonary exam normal        Cardiovascular hypertension, Pt. on medications and Pt. on home beta blockers Normal cardiovascular exam     Neuro/Psych negative neurological ROS  negative psych ROS   GI/Hepatic negative GI ROS, Neg liver ROS,,,  Endo/Other  negative endocrine ROS    Renal/GU negative Renal ROS  negative genitourinary   Musculoskeletal negative musculoskeletal ROS (+)    Abdominal   Peds  Hematology negative hematology ROS (+)   Anesthesia Other Findings Day of surgery medications reviewed with patient.  Reproductive/Obstetrics (+) Pregnancy                             Anesthesia Physical Anesthesia Plan  ASA: 2  Anesthesia Plan: Epidural   Post-op Pain Management:    Induction:   PONV Risk Score and Plan: Treatment may vary due to age or medical condition  Airway Management Planned: Natural Airway  Additional Equipment: Fetal Monitoring  Intra-op Plan:   Post-operative Plan:   Informed Consent: I have reviewed the patients History and Physical, chart, labs and discussed the procedure including the risks, benefits and alternatives for the proposed anesthesia with the patient or authorized representative who has indicated his/her understanding and acceptance.       Plan Discussed with:   Anesthesia Plan Comments:        Anesthesia Quick Evaluation

## 2022-03-12 NOTE — Progress Notes (Addendum)
Comfortable now with epiudral Mild h/a off/on and not severe; no ruq pain/vision changes  Patient Vitals for the past 24 hrs:  BP Temp Temp src Pulse Resp SpO2 Height Weight  03/12/22 1500 139/85 -- -- (!) 101 -- -- -- --  03/12/22 1455 (!) 133/90 -- -- (!) 108 -- -- -- --  03/12/22 1450 138/82 -- -- 99 -- -- -- --  03/12/22 1445 134/76 -- -- 99 -- -- -- --  03/12/22 1440 (!) 138/95 -- -- (!) 104 -- -- -- --  03/12/22 1438 -- 98.1 F (36.7 C) Oral -- -- -- -- --  03/12/22 1435 133/86 -- -- (!) 102 -- -- -- --  03/12/22 1430 138/88 -- -- (!) 102 -- -- -- --  03/12/22 1425 139/87 -- -- (!) 104 -- 100 % -- --  03/12/22 1422 -- -- -- -- -- 100 % -- --  03/12/22 1420 -- -- -- -- -- 100 % -- --  03/12/22 1400 129/87 -- -- (!) 106 -- -- -- --  03/12/22 1335 (!) 143/81 -- -- (!) 101 -- -- -- --  03/12/22 1300 138/79 -- -- (!) 115 -- -- -- --  03/12/22 1232 (!) 141/79 -- -- (!) 108 -- -- -- --  03/12/22 1206 (!) 148/87 -- -- (!) 110 -- -- -- --  03/12/22 1135 -- 98.2 F (36.8 C) Oral -- -- -- -- --  03/12/22 1130 135/71 -- -- (!) 113 -- -- -- --  03/12/22 1100 136/85 -- -- (!) 124 -- -- -- --  03/12/22 1030 132/77 -- -- (!) 108 -- -- -- --  03/12/22 1000 134/79 -- -- (!) 117 -- -- -- --  03/12/22 0930 138/78 -- -- (!) 110 -- -- -- --  03/12/22 0910 (!) 136/93 -- -- (!) 111 -- -- -- --  03/12/22 0822 138/87 98.8 F (37.1 C) Oral (!) 121 18 99 % -- --  03/12/22 0806 -- -- -- -- -- -- 5\' 5"  (1.651 m) 101.3 kg   A&ox3 Rrr Ctab Abd; soft,nd,nt; gravid Cx: 6/44/-0, cephalic LE: tr edema, nt bilat  FHT: 130s, nml variability, +accels, no decels TOCO: ctx, irregular; 1-63min  Results for orders placed or performed during the hospital encounter of 03/12/22 (from the past 24 hour(s))  CBC     Status: None   Collection Time: 03/12/22  8:34 AM  Result Value Ref Range   WBC 5.9 4.0 - 10.5 K/uL   RBC 4.29 3.87 - 5.11 MIL/uL   Hemoglobin 12.4 12.0 - 15.0 g/dL   HCT 37.6 36.0 - 46.0 %   MCV  87.6 80.0 - 100.0 fL   MCH 28.9 26.0 - 34.0 pg   MCHC 33.0 30.0 - 36.0 g/dL   RDW 15.0 11.5 - 15.5 %   Platelets 241 150 - 400 K/uL   nRBC 0.0 0.0 - 0.2 %  Type and screen [place]     Status: None   Collection Time: 03/12/22  8:34 AM  Result Value Ref Range   ABO/RH(D) O NEG    Antibody Screen POS    Sample Expiration 03/15/2022,2359    Antibody Identification      PASSIVELY ACQUIRED ANTI-D Performed at [facility], [address], [place], [place] [address]   RPR     Status: Abnormal   Collection Time: 03/12/22  8:34 AM  Result Value Ref Range   RPR Ser Ql Reactive (A) NON REACTIVE   RPR  Titer 1:1   Comprehensive metabolic panel     Status: Abnormal   Collection Time: 03/12/22  8:34 AM  Result Value Ref Range   Sodium 134 (L) 135 - 145 mmol/L   Potassium 4.0 3.5 - 5.1 mmol/L   Chloride 102 98 - 111 mmol/L   CO2 21 (L) 22 - 32 mmol/L   Glucose, Bld 94 70 - 99 mg/dL   BUN 6 6 - 20 mg/dL   Creatinine, Ser 0.49 0.44 - 1.00 mg/dL   Calcium 9.1 8.9 - 10.3 mg/dL   Total Protein 6.6 6.5 - 8.1 g/dL   Albumin 2.9 (L) 3.5 - 5.0 g/dL   AST 22 15 - 41 U/L   ALT 17 0 - 44 U/L   Alkaline Phosphatase 100 38 - 126 U/L   Total Bilirubin 0.2 (L) 0.3 - 1.2 mg/dL   GFR, Estimated >60 >60 mL/min   Anion gap 11 5 - 15    A/P: iup at 37wga IOL: s/p balloon catheter, now on pitocin; contin pitocin, plan svd 2.   Fetal status reassuring 3.  RH neg - s/p rhogam at 29.3wga; ab positive for passively acquired anti-d 4.  RPR positive 1:1 on admission; h/o false positive with neg treponema negative at 29 wga; plan workup for false positve after 6 wk pp 5. Mild pre-eclampsia: normal/mild range bps, continue to monitor   Cx: 5/60/-3; arom with clear fluid, pt tolerated well Fht cat 1

## 2022-03-12 NOTE — Progress Notes (Signed)
Verbal result from lab for baby DAT (positive) Baby B+   Mom O-  Shared result with [facility] charge nurse and patient's nurse

## 2022-03-13 ENCOUNTER — Other Ambulatory Visit: Payer: Self-pay

## 2022-03-13 LAB — KLEIHAUER-BETKE STAIN
# Vials RhIg: 1
Fetal Cells %: 0 %
Quantitation Fetal Hemoglobin: 0 mL

## 2022-03-13 LAB — CBC
HCT: 33.8 % — ABNORMAL LOW (ref 36.0–46.0)
Hemoglobin: 11.4 g/dL — ABNORMAL LOW (ref 12.0–15.0)
MCH: 29.5 pg (ref 26.0–34.0)
MCHC: 33.7 g/dL (ref 30.0–36.0)
MCV: 87.6 fL (ref 80.0–100.0)
Platelets: 208 10*3/uL (ref 150–400)
RBC: 3.86 MIL/uL — ABNORMAL LOW (ref 3.87–5.11)
RDW: 15.1 % (ref 11.5–15.5)
WBC: 8 10*3/uL (ref 4.0–10.5)
nRBC: 0 % (ref 0.0–0.2)

## 2022-03-13 MED ORDER — NIFEDIPINE ER OSMOTIC RELEASE 30 MG PO TB24
30.0000 mg | ORAL_TABLET | Freq: Every day | ORAL | Status: DC
  Administered 2022-03-13 – 2022-03-14 (×2): 30 mg via ORAL
  Filled 2022-03-13 (×2): qty 1

## 2022-03-13 MED ORDER — MONTELUKAST SODIUM 10 MG PO TABS: 10.0000 mg | ORAL_TABLET | Freq: Every day | ORAL | Status: DC | PRN

## 2022-03-13 MED ORDER — LABETALOL HCL 100 MG PO TABS
100.0000 mg | ORAL_TABLET | Freq: Every day | ORAL | Status: DC
  Administered 2022-03-13 – 2022-03-14 (×2): 100 mg via ORAL
  Filled 2022-03-13 (×2): qty 1

## 2022-03-13 MED ORDER — RHO D IMMUNE GLOBULIN 1500 UNIT/2ML IJ SOSY
300.0000 ug | PREFILLED_SYRINGE | Freq: Once | INTRAMUSCULAR | Status: AC
  Administered 2022-03-13: 300 ug via INTRAVENOUS
  Filled 2022-03-13: qty 2

## 2022-03-13 MED ORDER — NIFEDIPINE ER OSMOTIC RELEASE 30 MG PO TB24: 30.0000 mg | ORAL_TABLET | Freq: Every day | ORAL | Status: DC

## 2022-03-13 NOTE — Progress Notes (Signed)
Marguerite Olea LPN from [facility] Reported off to Mother baby Rn that per Dr. Murrell Redden , the patient has a false positive RPR . Other test results will be resulted in 2 to 3 days.

## 2022-03-13 NOTE — Lactation Note (Signed)
This note was copied from a baby's chart. Lactation Consultation Note  Patient Name: Melissa Howell Date: 03/13/2022   Age:34 hours   LC Note:  Per RN, mother declined lactation services.   Maternal Data    Feeding    LATCH Score                    Lactation Tools Discussed/Used    Interventions    Discharge    Consult Status      Little Ishikawa 03/13/2022, 5:58 AM

## 2022-03-13 NOTE — Anesthesia Postprocedure Evaluation (Signed)
Anesthesia Post Note  Patient: Melissa Howell  Procedure(s) Performed: AN AD HOC LABOR EPIDURAL     Patient location during evaluation: Mother Baby Anesthesia Type: Epidural Level of consciousness: awake and alert Pain management: pain level controlled Vital Signs Assessment: post-procedure vital signs reviewed and stable Respiratory status: spontaneous breathing, nonlabored ventilation and respiratory function stable Cardiovascular status: stable Postop Assessment: no headache, no backache, epidural receding and able to ambulate Anesthetic complications: no   No notable events documented.  Last Vitals:  Vitals:   03/13/22 0608 03/13/22 0927  BP: (!) 133/94 (!) 150/88  Pulse: (!) 101 (!) 104  Resp: 18   Temp:    SpO2:      Last Pain:  Vitals:   03/13/22 0531  TempSrc:   PainSc: 2    Pain Goal:                   [name]

## 2022-03-13 NOTE — Progress Notes (Addendum)
RN called Derrell Lolling CNM regarding patient's increased Blood pressures. An order was given for Procardia 30 MG XL once a day.

## 2022-03-13 NOTE — Plan of Care (Signed)
  Problem: Education: Goal: Knowledge of General Education information will improve Description: Including pain rating scale, medication(s)/side effects and non-pharmacologic comfort measures Outcome: Completed/Met   Problem: Clinical Measurements: Goal: Will remain free from infection Outcome: Completed/Met Goal: Diagnostic test results will improve Outcome: Completed/Met Goal: Respiratory complications will improve Outcome: Completed/Met Goal: Cardiovascular complication will be avoided Outcome: Completed/Met   Problem: Pain Managment: Goal: General experience of comfort will improve Outcome: Completed/Met

## 2022-03-13 NOTE — Progress Notes (Signed)
No c/o; pain controlled No h/a, vision changes, ruq pain Normal lochia, voids w/o difficulty breastfeeding  Patient Vitals for the past 24 hrs:  BP Temp Temp src Pulse Resp SpO2  03/13/22 0927 (!) 150/88 -- -- (!) 104 -- --  03/13/22 0608 (!) 133/94 -- -- (!) 101 18 --  03/13/22 0552 (!) 131/92 -- -- (!) 103 18 --  03/13/22 0530 (!) 140/88 98.2 F (36.8 C) Oral (!) 107 18 97 %  03/13/22 0133 129/80 98.2 F (36.8 C) Oral (!) 102 18 97 %  03/12/22 2215 127/76 98.2 F (36.8 C) Oral (!) 112 18 97 %  03/12/22 2130 124/83 98.5 F (36.9 C) Oral (!) 102 18 97 %  03/12/22 1915 (!) 141/74 -- -- 97 -- --  03/12/22 1900 133/70 -- -- 95 -- --  03/12/22 1800 137/89 -- -- (!) 109 -- --  03/12/22 1730 (!) 141/90 -- -- (!) 108 -- --  03/12/22 1725 -- 98 F (36.7 C) Oral -- -- --  03/12/22 1700 129/86 -- -- (!) 107 -- --  03/12/22 1630 131/82 -- -- (!) 109 -- --  03/12/22 1600 (!) 154/89 -- -- (!) 105 -- --  03/12/22 1530 (!) 142/91 -- -- 100 -- --  03/12/22 1500 139/85 -- -- (!) 101 -- --  03/12/22 1455 (!) 133/90 -- -- (!) 108 -- --  03/12/22 1450 138/82 -- -- 99 -- --  03/12/22 1445 134/76 -- -- 99 -- --  03/12/22 1440 (!) 138/95 -- -- (!) 104 -- --  03/12/22 1438 -- 98.1 F (36.7 C) Oral -- -- --  03/12/22 1435 133/86 -- -- (!) 102 -- --  03/12/22 1430 138/88 -- -- (!) 102 -- --  03/12/22 1425 139/87 -- -- (!) 104 -- 100 %  03/12/22 1422 -- -- -- -- -- 100 %  03/12/22 1420 -- -- -- -- -- 100 %  03/12/22 1400 129/87 -- -- (!) 106 -- --  03/12/22 1335 (!) 143/81 -- -- (!) 101 -- --  03/12/22 1300 138/79 -- -- (!) 115 -- --  03/12/22 1232 (!) 141/79 -- -- (!) 108 -- --  03/12/22 1206 (!) 148/87 -- -- (!) 110 -- --  03/12/22 1135 -- 98.2 F (36.8 C) Oral -- -- --  03/12/22 1130 135/71 -- -- (!) 113 -- --  03/12/22 1100 136/85 -- -- (!) 124 -- --   A&ox3 Nml respirations Abd: soft,nt,nd; fundus firm and below umb LE: no edema, nt bilat     Latest Ref Rng & Units 03/13/2022     5:14 AM 03/12/2022    8:34 AM 10/02/2020    2:35 PM  CBC  WBC 4.0 - 10.5 K/uL 8.0  5.9  5.4   Hemoglobin 12.0 - 15.0 g/dL 11.4  12.4  11.9   Hematocrit 36.0 - 46.0 % 33.8  37.6  38.4   Platelets 150 - 400 K/uL 208  241  411    A/P: ppd1 s/p svd Doing well, contin care, plan d/c home tomorrow Mild pre-eclampsia - now on procardia 30mg  po q day added to labetalol 100mg  tid, follow bps closely Mild acute anemia d/t blood loss  - asymptomatic, plan iron rich foods Rh neg Boy, breastfeeding; DAT positive, KB pending H/o false RPR, current treponema test pending; plan workup after 6 wks pp RI

## 2022-03-14 LAB — RH IG WORKUP (INCLUDES ABO/RH)
Gestational Age(Wks): 39
Unit division: 0

## 2022-03-14 MED ORDER — IBUPROFEN 600 MG PO TABS: 600.0000 mg | ORAL_TABLET | Freq: Four times a day (QID) | ORAL | 0 refills | Status: AC

## 2022-03-14 MED ORDER — NIFEDIPINE ER 30 MG PO TB24: 30.0000 mg | ORAL_TABLET | Freq: Every day | ORAL | 1 refills | Status: AC

## 2022-03-14 NOTE — Progress Notes (Signed)
No c/o; pain controlled; voids w/o problem No h/a, vision changes, ruq pain Nml lochia, breastfeeding  Patient Vitals for the past 24 hrs:  BP Temp Temp src Pulse Resp SpO2  03/14/22 0533 135/87 98 F (36.7 C) Oral 100 18 --  03/13/22 2349 129/84 -- -- 100 -- --  03/13/22 2012 (!) 127/90 97.7 F (36.5 C) Oral (!) 112 18 --  03/13/22 1708 117/68 97.9 F (36.6 C) Oral (!) 101 20 96 %   A&ox3 Nml respirations Abd: soft,nt, nd; fundus firm and below umb LE: no edema, nt bilat     Latest Ref Rng & Units 03/13/2022    5:14 AM 03/12/2022    8:34 AM 10/02/2020    2:35 PM  CBC  WBC 4.0 - 10.5 K/uL 8.0  5.9  5.4   Hemoglobin 12.0 - 15.0 g/dL 11.4  12.4  11.9   Hematocrit 36.0 - 46.0 % 33.8  37.6  38.4   Platelets 150 - 400 K/uL 208  241  411    A/P: ppd 2 s/p svd Doing well, contin care, plan d/c home today; f/u 1 wk Mild pre-eclampsia -  procardia 30mg  po q day, held labetalol d/t low bp; will continue with procardia 30 qd and plan f/u in 1 wk; pt will do home bps to follow Mild acute anemia d/t blood loss  - asymptomatic, plan iron rich foods Rh neg Boy, breastfeeding; DAT positive,  H/o false RPR, current treponema test pending; plan workup after 6 wks pp; negative treponema at 28wga RI

## 2022-03-14 NOTE — Discharge Summary (Signed)
Postpartum Discharge Summary  Date of Service updated     Patient Name: Melissa Howell DOB: 06-27-1990 MRN: [id number]  Date of admission: 03/12/2022 Delivery date:03/12/2022  Delivering provider: Juliene Pina  Date of discharge: 03/14/2022  Admitting diagnosis: Pregnant [Z34.90] Intrauterine pregnancy: [redacted]w[redacted]d     Secondary diagnosis:  Principal Problem:   Postpartum care following vaginal delivery 1/19 Active Problems:   SVD (spontaneous vaginal delivery)   False positive RPR   Preeclampsia, third trimester  Additional problems: none    Hospital course: Induction of Labor With Vaginal Delivery   32 y.o. yo [id number] at [redacted]w[redacted]d was admitted to the hospital 03/12/2022 for induction of labor.  Indication for induction: Preeclampsia.  Patient had an uncomplicated labor course. Membrane Rupture Time/Date: 4:41 PM ,03/12/2022   Delivery Method:Vaginal, Spontaneous  Episiotomy: None  Lacerations:  None  Details of delivery can be found in separate delivery note.  . Patient is discharged home 03/14/22.  Newborn Data: Birth date:03/12/2022  Birth time:6:50 PM  Gender:Female  Living status:Living  Apgars:9 ,9  Weight:3310 g    Physical exam  Vitals:   03/13/22 1708 03/13/22 2012 03/13/22 2349 03/14/22 0533  BP: 117/68 (!) 127/90 129/84 135/87  Pulse: (!) 101 (!) 112 100 100  Resp: 20 18  18   Temp: 97.9 F (36.6 C) 97.7 F (36.5 C)  98 F (36.7 C)  TempSrc: Oral Oral  Oral  SpO2: 96%     Weight:      Height:       General: alert, cooperative, and no distress Lochia: appropriate Uterine Fundus: firm Incision: Healing well with no significant drainage DVT Evaluation: No evidence of DVT seen on physical exam. Labs: Lab Results  Component Value Date   WBC 8.0 03/13/2022   HGB 11.4 (L) 03/13/2022   HCT 33.8 (L) 03/13/2022   MCV 87.6 03/13/2022   PLT 208 03/13/2022      Latest Ref Rng & Units 03/12/2022    8:34 AM  CMP  Glucose 70 - 99 mg/dL 94   BUN 6 - 20 mg/dL 6    Creatinine 0.44 - 1.00 mg/dL 0.49   Sodium 135 - 145 mmol/L 134   Potassium 3.5 - 5.1 mmol/L 4.0   Chloride 98 - 111 mmol/L 102   CO2 22 - 32 mmol/L 21   Calcium 8.9 - 10.3 mg/dL 9.1   Total Protein 6.5 - 8.1 g/dL 6.6   Total Bilirubin 0.3 - 1.2 mg/dL 0.2   Alkaline Phos 38 - 126 U/L 100   AST 15 - 41 U/L 22   ALT 0 - 44 U/L 17    Edinburgh Score:    03/13/2022    9:26 AM  Edinburgh Postnatal Depression Scale Screening Tool  I have been able to laugh and see the funny side of things. 0  I have looked forward with enjoyment to things. 0  I have blamed myself unnecessarily when things went wrong. 0  I have been anxious or worried for no good reason. 0  I have felt scared or panicky for no good reason. 0  Things have been getting on top of me. 0  I have been so unhappy that I have had difficulty sleeping. 0  I have felt sad or miserable. 0  I have been so unhappy that I have been crying. 0  The thought of harming myself has occurred to me. 0  Edinburgh Postnatal Depression Scale Total 0      After  visit meds:  Allergies as of 03/14/2022       Reactions   Zestril [lisinopril] Swelling   Lip swelling        Medication List     STOP taking these medications    aspirin EC 81 MG tablet   ferrous gluconate 324 MG tablet Commonly known as: FERGON   labetalol 100 MG tablet Commonly known as: NORMODYNE   lisinopril 10 MG tablet Commonly known as: ZESTRIL       TAKE these medications    ibuprofen 600 MG tablet Commonly known as: ADVIL Take 1 tablet (600 mg total) by mouth every 6 (six) hours.   montelukast 10 MG tablet Commonly known as: Singulair Take 1 tablet (10 mg total) by mouth at bedtime.   NIFEdipine 30 MG 24 hr tablet Commonly known as: ADALAT CC Take 1 tablet (30 mg total) by mouth daily. Start taking on: March 15, 2022   prenatal multivitamin Tabs tablet Take 1 tablet by mouth daily.         Discharge home in stable condition Infant  Feeding: Breast Infant Disposition:home with mother Discharge instruction: per After Visit Summary and Postpartum booklet. Activity: Advance as tolerated. Pelvic rest for 6 weeks.  Diet: low salt diet Anticipated Birth Control: IUD Postpartum Appointment:6 weeks Additional Postpartum F/U: BP check 1 week Future Appointments:No future appointments. Follow up Visit:  Follow-up Information     Kristl Morioka, Earlyne Iba, MD Follow up.   Specialty: Obstetrics and Gynecology Contact information: [address] [place] [address] [phone]                     03/14/2022 Charyl Bigger, MD

## 2022-03-15 LAB — T.PALLIDUM AB, TOTAL: T Pallidum Abs: NONREACTIVE

## 2022-03-20 ENCOUNTER — Telehealth (HOSPITAL_COMMUNITY): Payer: Self-pay

## 2022-03-20 NOTE — Telephone Encounter (Signed)
Patient did not answer phone call. Voicemail left for patient.   Sharyn Lull [facility] 03/20/22,1429
